# Patient Record
Sex: Male | Born: 1960 | Race: Black or African American | Hispanic: No | Marital: Single | Smoking: Never smoker
Health system: Southern US, Community
[De-identification: ages and names within clinical notes are randomized; demographics above are authoritative.]

## PROBLEM LIST (undated history)

## (undated) DIAGNOSIS — I10 Essential (primary) hypertension: Secondary | ICD-10-CM

## (undated) DIAGNOSIS — E119 Type 2 diabetes mellitus without complications: Secondary | ICD-10-CM

## (undated) HISTORY — PX: LITHOTRIPSY: SUR834

## (undated) HISTORY — PX: KIDNEY STONE SURGERY: SHX686

---

## 2000-01-14 ENCOUNTER — Ambulatory Visit (HOSPITAL_COMMUNITY): Admission: RE | Admit: 2000-01-14 | Discharge: 2000-01-14 | Payer: Self-pay | Admitting: Nephrology

## 2000-01-14 ENCOUNTER — Encounter: Payer: Self-pay | Admitting: Nephrology

## 2004-08-22 ENCOUNTER — Emergency Department: Payer: Self-pay | Admitting: Unknown Physician Specialty

## 2004-11-04 ENCOUNTER — Ambulatory Visit (HOSPITAL_COMMUNITY): Admission: RE | Admit: 2004-11-04 | Discharge: 2004-11-04 | Payer: Self-pay | Admitting: Nephrology

## 2004-11-06 ENCOUNTER — Ambulatory Visit (HOSPITAL_COMMUNITY): Admission: RE | Admit: 2004-11-06 | Discharge: 2004-11-06 | Payer: Self-pay | Admitting: Nephrology

## 2005-01-15 ENCOUNTER — Emergency Department: Payer: Self-pay | Admitting: Emergency Medicine

## 2005-01-21 ENCOUNTER — Ambulatory Visit: Payer: Self-pay | Admitting: Specialist

## 2005-02-04 ENCOUNTER — Ambulatory Visit: Payer: Self-pay | Admitting: Specialist

## 2005-02-10 ENCOUNTER — Emergency Department: Payer: Self-pay | Admitting: Unknown Physician Specialty

## 2005-02-24 ENCOUNTER — Emergency Department: Payer: Self-pay | Admitting: Emergency Medicine

## 2006-12-21 ENCOUNTER — Emergency Department: Payer: Self-pay | Admitting: Emergency Medicine

## 2007-12-04 ENCOUNTER — Emergency Department: Payer: Self-pay | Admitting: Unknown Physician Specialty

## 2007-12-05 ENCOUNTER — Emergency Department: Payer: Self-pay | Admitting: Emergency Medicine

## 2010-07-01 ENCOUNTER — Ambulatory Visit (HOSPITAL_COMMUNITY): Admission: RE | Admit: 2010-07-01 | Discharge: 2010-07-01 | Payer: Self-pay | Admitting: Neurosurgery

## 2010-11-03 LAB — GLUCOSE, CAPILLARY: Glucose-Capillary: 99 mg/dL (ref 70–99)

## 2012-08-06 ENCOUNTER — Emergency Department: Payer: Self-pay | Admitting: Internal Medicine

## 2012-08-07 ENCOUNTER — Emergency Department: Payer: Self-pay | Admitting: Emergency Medicine

## 2013-12-31 ENCOUNTER — Ambulatory Visit: Payer: Self-pay | Admitting: Gastroenterology

## 2014-01-08 ENCOUNTER — Ambulatory Visit: Payer: Self-pay | Admitting: Family Medicine

## 2014-07-19 ENCOUNTER — Emergency Department: Payer: Self-pay | Admitting: Student

## 2014-10-31 ENCOUNTER — Emergency Department: Payer: Self-pay | Admitting: Emergency Medicine

## 2014-11-05 ENCOUNTER — Ambulatory Visit: Payer: Self-pay

## 2014-11-07 ENCOUNTER — Ambulatory Visit: Payer: Self-pay | Admitting: Urology

## 2014-11-21 ENCOUNTER — Ambulatory Visit
Admit: 2014-11-21 | Disposition: A | Discharge status: Home / Self Care | Payer: Self-pay | Admitting: Obstetrics and Gynecology

## 2014-12-02 ENCOUNTER — Ambulatory Visit
Admit: 2014-12-02 | Disposition: A | Discharge status: Home / Self Care | Payer: Self-pay | Attending: Urology | Admitting: Urology

## 2014-12-02 LAB — BASIC METABOLIC PANEL
Anion Gap: 6 — ABNORMAL LOW (ref 7–16)
BUN: 15 mg/dL
CO2: 27 mmol/L
CREATININE: 1.06 mg/dL
Calcium, Total: 9 mg/dL
Chloride: 109 mmol/L
EGFR (Non-African Amer.): >60
GLUCOSE: 117 mg/dL — AB
Potassium: 4.6 mmol/L
Sodium: 142 mmol/L

## 2014-12-12 ENCOUNTER — Ambulatory Visit
Admit: 2014-12-12 | Disposition: A | Discharge status: Home / Self Care | Payer: Self-pay | Attending: Urology | Admitting: Urology

## 2014-12-22 NOTE — Op Note (Signed)
PATIENT NAME:  Jonathon Morris, Jonathon Morris MR#:  [id number] DATE OF BIRTH:  05/12/1961  DATE OF PROCEDURE:  12/12/2014  PREOPERATIVE DIAGNOSES:  Left ureteral stone, left kidney stone.    POSTOPERATIVE DIAGNOSES:  Left ureteral stone, left kidney stone.    PROCEDURE PERFORMED:  Left ureteroscopy, laser lithotripsy, left ureteral stent placement, retrograde pyelogram.   ANESTHESIA:  General anesthesia.  ATTENDING SURGEON:  Sherlynn Stalls, MD.   ESTIMATED BLOOD LOSS:  Minimal.   DRAINS:  A 6 x 26-French double-J ureteral stent on the left.    SPECIMEN:  Stone fragment.   COMPLICATIONS:  None.   INDICATION:  This is a 54 year old male who presented with acute-onset left flank pain and found to have two large stones; one approximately 13 mm in the left proximal ureter as well as a 1 mm nonobstructing stone in his left mid pole.  He was counseled on the various treatment options, and he elected to undergo left ureteroscopy for treatment of his stones.  Risks and benefits of the procedure were explained in detail.  The patient agreed to proceed as planned.   PROCEDURE IN DETAIL:  The patient was correctly identified in the preoperative holding area, and informed consent was confirmed.  He was brought to the operating suite and placed on the table in the supine position.  At this time, universal timeout protocol was performed and team members were identified.  Venodyne boots were placed and we administered IV Levaquin 500 mg in the perioperative period.  He was then placed under general anesthesia and prepped and draped in a standard surgical fashion after being repositioned in the dorsolithotomy position.  At this point in time, a rigid cystoscope using a 21-French access sheath was advanced per urethra into the bladder.  Of note, the prostate was fairly large and obstructing in appearance with a slightly elevated bladder neck.  Attention was then turned to the left ureteral orifice which was cannulated using  a 5-French open-ended ureteral catheter.  A Sensor wire was attempted to be advanced within the distal ureter but met resistance within the distal ureter which appeared to be secondary to J hooking from an enlarged prostate.  I was able to ultimately advance an angled Glidewire up to the level of the kidney without difficulty presumably past the ureteral stone, although this was not easily evident on fluoroscopy.  The 5-French open-ended ureteral catheter was then advanced up to the level of the proximal ureter and the Glidewire was exchanged for a Sensor wire.  The scope was then removed and a dual-lumen access sheath was advanced just within the distal ureter, and a second wire was introduced all the way up to the level of renal pelvis, confirmed under fluoroscopic guidance.  At this point in time, 1 wire was used as a safety wire and the second as a working wire.  An 8-French flexible Wolf ureteroscope was then advanced over the working wire all the way up to the level of the kidney without difficulty.  All of the calyces were directly visualized, and the stone in the mid pole calyx was identified.  Of note, it was fairly large in size and there was a bridge of mucosa overlying the middle of the stone such that the superior and inferior aspects of the stone were visualized.  At this point in time, a 200-micron laser fiber was brought in.  Using settings of 0.8 joules in 12 bursts, the laser was used to ablate the tissue bridge so the  full stone could be identified. The stone was then fragmented into very tiny fragments just slightly larger than the tip of the 200-micron laser fiber.  This was somewhat time-consuming as the stone appeared to be very layered and hard.  Once the stone was satisfactorily fragmented, the scope was backed to the level of the UPJ, and a retrograde pyelogram was performed to create a road map of the kidney.  At this point in time using the road map, each and every calyx was directly  visualized.  The scope was then slowly backed to the level of the ureter.  The proximal ureter was carefully inspected as this is where the large stone had been identified on a previous CT scan.  Upon removing the scope, I did encounter the stone within the distal ureter below the iliac crest near the level of the iliac vessels.  The stone was also fairly large in size and hard appearing.  The laser was then used to fragment the stone into very, very small pieces with care taken to avoid any injury to the ureter.  Once this was achieved, the scope and laser fiber were removed.  At this point in time, a semi-rigid 5-French long ureteroscope was then readvanced to the level where the stone was and individual larger pieces were basketed out using a 1.9-French nitinol basket.  At this point in time, the ureter was deemed clear of stone fragment.  Scope was able to be passed back up to the proximal ureter and there was no remaining stone fragments identified.  The scope was then removed.  The safety wire was then back-loaded over the rigid cystoscope, and a 6 x 26-French double-J ureteral stent was advanced to the level of the kidney.  The wire was partially withdrawn and a coil was noted within the renal pelvis.  The wire was then fully withdrawn and the coil was noted within the bladder.  The bladder was then drained.  Several small stone fragments were captured to be sent for stone analysis.  The patient was then repositioned in the supine position.  After being cleaned and dried and reversed from anesthesia, he was taken to the PACU in stable condition.  There were no complications.      ____________________________ Sherlynn Stalls, MD ajb:kc D: 12/12/2014 15:36:38 ET T: 12/12/2014 16:47:17 ET JOB#: [id number]  cc: Sherlynn Stalls, MD, <Dictator> Sherlynn Stalls MD ELECTRONICALLY SIGNED 12/19/2014 16:24

## 2015-02-10 ENCOUNTER — Other Ambulatory Visit: Payer: Self-pay | Admitting: Family Medicine

## 2015-02-10 DIAGNOSIS — N200 Calculus of kidney: Secondary | ICD-10-CM

## 2015-03-28 ENCOUNTER — Encounter: Payer: Self-pay | Admitting: Emergency Medicine

## 2015-03-28 ENCOUNTER — Emergency Department
Admission: EM | Admit: 2015-03-28 | Discharge: 2015-03-28 | Disposition: A | Discharge status: Home / Self Care | Payer: Worker's Compensation | Attending: Emergency Medicine | Admitting: Emergency Medicine

## 2015-03-28 DIAGNOSIS — X12XXXA Contact with other hot fluids, initial encounter: Secondary | ICD-10-CM | POA: Diagnosis not present

## 2015-03-28 DIAGNOSIS — T25029A Burn of unspecified degree of unspecified foot, initial encounter: Secondary | ICD-10-CM | POA: Diagnosis present

## 2015-03-28 DIAGNOSIS — Y9389 Activity, other specified: Secondary | ICD-10-CM | POA: Diagnosis not present

## 2015-03-28 DIAGNOSIS — T25121A Burn of first degree of right foot, initial encounter: Secondary | ICD-10-CM | POA: Diagnosis not present

## 2015-03-28 DIAGNOSIS — Y9289 Other specified places as the place of occurrence of the external cause: Secondary | ICD-10-CM | POA: Diagnosis not present

## 2015-03-28 DIAGNOSIS — T300 Burn of unspecified body region, unspecified degree: Secondary | ICD-10-CM

## 2015-03-28 DIAGNOSIS — Y990 Civilian activity done for income or pay: Secondary | ICD-10-CM | POA: Diagnosis not present

## 2015-03-28 NOTE — Discharge Instructions (Signed)
Burn Care Your skin is a natural barrier to infection. It is the largest organ of your body. Burns damage this natural protection. To help prevent infection, it is very important to follow your caregiver's instructions in the care of your burn. Burns are classified as:  First degree. There is only redness of the skin (erythema). No scarring is expected.  Second degree. There is blistering of the skin. Scarring may occur with deeper burns.  Third degree. All layers of the skin are injured, and scarring is expected. HOME CARE INSTRUCTIONS   Wash your hands well before changing your bandage.  Change your bandage as often as directed by your caregiver.  Remove the old bandage. If the bandage sticks, you may soak it off with cool, clean water.  Cleanse the burn thoroughly but gently with mild soap and water.  Pat the area dry with a clean, dry cloth.  Apply a thin layer of antibacterial cream to the burn.  Apply a clean bandage as instructed by your caregiver.  Keep the bandage as clean and dry as possible.  Elevate the affected area for the first 24 hours, then as instructed by your caregiver.  Only take over-the-counter or prescription medicines for pain, discomfort, or fever as directed by your caregiver. SEEK IMMEDIATE MEDICAL CARE IF:   You develop excessive pain.  You develop redness, tenderness, swelling, or red streaks near the burn.  The burned area develops yellowish-white fluid (pus) or a bad smell.  You have a fever. MAKE SURE YOU:   Understand these instructions.  Will watch your condition.  Will get help right away if you are not doing well or get worse. Document Released: 08/09/2005 Document Revised: 11/01/2011 Document Reviewed: 12/30/2010 [facility] Patient Information 2015 [place], [place]. This information is not intended to replace advice given to you by your health care provider. Make sure you discuss any questions you have with your health care  provider.   Apply moisturizing cream to keep the skin supple.
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
## 2015-09-02 ENCOUNTER — Other Ambulatory Visit: Payer: Self-pay

## 2015-09-02 ENCOUNTER — Ambulatory Visit
Admission: RE | Admit: 2015-09-02 | Discharge: 2015-09-02 | Disposition: A | Discharge status: Home / Self Care | Payer: Managed Care, Other (non HMO) | Source: Ambulatory Visit | Attending: Urology | Admitting: Urology

## 2015-09-02 ENCOUNTER — Telehealth: Payer: Self-pay

## 2015-09-02 DIAGNOSIS — N200 Calculus of kidney: Secondary | ICD-10-CM | POA: Diagnosis present

## 2015-09-02 DIAGNOSIS — Z87442 Personal history of urinary calculi: Secondary | ICD-10-CM | POA: Diagnosis not present

## 2015-09-02 NOTE — Telephone Encounter (Signed)
-----   Message from Harle BattiestShannon A McGowan, PA-C sent at 09/02/2015 12:04 PM EST ----- Patient has not made an appointment to discuss his pain and KUB results.

## 2015-09-02 NOTE — Progress Notes (Signed)
Pt called stating he had ESWL last April by Dr. Apolinar JunesBrandon and is starting to have left side flank pain again. Pt requested a KUB. Per Carollee HerterShannon orders were placed.

## 2015-09-02 NOTE — Telephone Encounter (Signed)
Spoke with pt and made aware for the need of an office visit. Pt voiced understanding and was transferred to the front to make a f/u appt.

## 2015-09-05 ENCOUNTER — Ambulatory Visit: Payer: Self-pay | Admitting: Urology

## 2015-09-16 ENCOUNTER — Encounter: Payer: Managed Care, Other (non HMO) | Admitting: Urology

## 2015-10-01 NOTE — Progress Notes (Signed)
This encounter was created in error - please disregard.

## 2017-04-10 ENCOUNTER — Ambulatory Visit
Admission: EM | Admit: 2017-04-10 | Discharge: 2017-04-10 | Disposition: A | Discharge status: Home / Self Care | Payer: 59 | Attending: Family Medicine | Admitting: Family Medicine

## 2017-04-10 DIAGNOSIS — M26629 Arthralgia of temporomandibular joint, unspecified side: Secondary | ICD-10-CM

## 2017-04-10 DIAGNOSIS — S0340XA Sprain of jaw, unspecified side, initial encounter: Secondary | ICD-10-CM

## 2017-04-10 DIAGNOSIS — R202 Paresthesia of skin: Secondary | ICD-10-CM

## 2017-04-10 HISTORY — DX: Essential (primary) hypertension: I10

## 2017-04-10 HISTORY — DX: Type 2 diabetes mellitus without complications: E11.9

## 2017-04-10 MED ORDER — MELOXICAM 15 MG PO TABS: 15.0000 mg | ORAL_TABLET | Freq: Every day | ORAL | 0 refills | Status: DC

## 2017-04-10 NOTE — ED Provider Notes (Signed)
[facility]    CSN: [id number] Arrival date & time: 04/10/17  1039     History   Chief Complaint Chief Complaint  Patient presents with  . Jaw Pain    HPI Jonathon Morris is a 56 y.o. male.   56 year old obese black male comes to the urgent care because he states she's had a stroke. He reports he was chewing some jellybeans when suddenly felt sharp pain in his right side of his jaw and numbness. He came in directly be seen and evaluated. Since she's been here his notes improvement of the jaw the numbness is gone away the pain in the jaw is improved as well. He has a history of grinding his teeth at night. Past smoker problems hypertension and type 2 diabetes surgeries a kidney stone surgery and lithotripsy. No known drug allergies. He does not smoke. No pertinent family medical history relevant to today's visit.   The history is provided by the patient. No language interpreter was used.  Facial Injury  Mechanism of injury:  Unable to specify Location:  R cheek Time since incident:  2 hours Pain details:    Quality:  Aching and shooting   Severity:  Moderate   Timing:  Intermittent   Progression:  Waxing and waning Foreign body present:  No foreign bodies Relieved by:  Nothing Worsened by:  Nothing Associated symptoms: no altered mental status, no epistaxis, no neck pain and no rhinorrhea     Past Medical History:  Diagnosis Date  . Hypertension   . Type 2 diabetes mellitus (HCC)     There are no active problems to display for this patient.   Past Surgical History:  Procedure Laterality Date  . KIDNEY STONE SURGERY    . LITHOTRIPSY         Home Medications    Prior to Admission medications   Medication Sig Start Date End Date Taking? Authorizing Provider  finasteride (PROSCAR) 5 MG tablet Take 5 mg by mouth daily.   Yes [provider]  losartan (COZAAR) 100 MG tablet Take 100 mg by mouth daily.   Yes [provider]  metformin  (FORTAMET) 500 MG (OSM) 24 hr tablet Take 500 mg by mouth daily with breakfast.   Yes [provider]  sildenafil (VIAGRA) 100 MG tablet Take 100 mg by mouth daily as needed for erectile dysfunction.   Yes [provider]  timolol (TIMOPTIC) 0.5 % ophthalmic solution 1 drop 2 (two) times daily.   Yes [provider]  meloxicam (MOBIC) 15 MG tablet Take 1 tablet (15 mg total) by mouth daily. 04/10/17   Hassan Rowan, MD    Family History History reviewed. No pertinent family history.  Social History Social History  Substance Use Topics  . Smoking status: Never Smoker  . Smokeless tobacco: Never Used  . Alcohol use No     Allergies   Patient has no known allergies.   Review of Systems Review of Systems  HENT: Positive for dental problem and facial swelling. Negative for nosebleeds and rhinorrhea.   Musculoskeletal: Negative for neck pain.  All other systems reviewed and are negative.    Physical Exam Triage Vital Signs ED Triage Vitals  Enc Vitals Group     BP 04/10/17 1204 140/82     Pulse Rate 04/10/17 1204 71     Resp 04/10/17 1204 16     Temp 04/10/17 1204 98.2 F (36.8 C)     Temp Source 04/10/17 1204  Oral     SpO2 04/10/17 1204 100 %     Weight 04/10/17 1200 237 lb (107.5 kg)     Height 04/10/17 1200 5\' 6"  (1.676 m)     Head Circumference --      Peak Flow --      Pain Score 04/10/17 1200 0     Pain Loc --      Pain Edu? --      Excl. in GC? --    No data found.   Updated Vital Signs BP 140/82 (BP Location: Left Arm)   Pulse 71   Temp 98.2 F (36.8 C) (Oral)   Resp 16   Ht 5\' 6"  (1.676 m)   Wt 237 lb (107.5 kg)   SpO2 100%   BMI 38.25 kg/m   Visual Acuity Right Eye Distance:   Left Eye Distance:   Bilateral Distance:    Right Eye Near:   Left Eye Near:    Bilateral Near:     Physical Exam  Constitutional: He appears well-developed and well-nourished.  HENT:  Head: Normocephalic and atraumatic.    Right Ear:  External ear normal.  Left Ear: External ear normal.  Nose: Nose normal.  Mouth/Throat: Uvula is midline, oropharynx is clear and moist and mucous membranes are normal.  Neurologically no signs of significant difference in the 2 sides of face no signs of cranial nerve VII injury or disruption. Patient does have marked tenderness in the over the right TMJ joint with palpation able to reproduce the discomfort he had while driving while he was chewing the jellybeans  Eyes: Pupils are equal, round, and reactive to light.  Neck: Normal range of motion. Neck supple.  Pulmonary/Chest: Effort normal.  Musculoskeletal: Normal range of motion.  Neurological: He is alert.  Skin: Skin is warm. He is not diaphoretic.  Psychiatric: He has a normal mood and affect.  Vitals reviewed.    UC Treatments / Results  Labs (all labs ordered are listed, but only abnormal results are displayed) Labs Reviewed - No data to display  EKG  EKG Interpretation None       Radiology No results found.  Procedures Procedures (including critical care time)  Medications Ordered in UC Medications - No data to display   Initial Impression / Assessment and Plan / UC Course  I have reviewed the triage vital signs and the nursing notes.  Pertinent labs & imaging results that were available during my care of the patient were reviewed by me and considered in my medical decision making (see chart for details).     The patient had a TMJ inflammation and irritation. Turns out he does grind his teeth at night Jonathon Morris has warned him he should be wearing a mouthguard amount of grinding of his teeth that he does recommend Mobic 15 mg 1 tablet a day follow-up of his tenderness is needed he still has some teeth they may need to be extracted. He declined a work note  Final Clinical Impressions(s) / UC Diagnoses   Final diagnoses:  TMJ (sprain of temporomandibular joint), initial encounter  TMJ syndrome    New  Prescriptions Current Discharge Medication List    START taking these medications   Details  meloxicam (MOBIC) 15 MG tablet Take 1 tablet (15 mg total) by mouth daily. Qty: 30 tablet, Refills: 0       Note: This dictation was prepared with Dragon dictation along with smaller phrase technology. Any transcriptional errors that result from this  process are unintentional. Controlled Substance Prescriptions [place] Controlled Substance Registry consulted? Not Applicable   Hassan Rowan, MD 04/10/17 1258

## 2017-04-10 NOTE — ED Triage Notes (Signed)
Patient complains of jaw pain that occurred after eating jelly beans this morning. Patient states that he started having numbness in his temple area. Patient states that now he feels a lump in his cheek area.

## 2017-10-02 ENCOUNTER — Ambulatory Visit
Admission: EM | Admit: 2017-10-02 | Discharge: 2017-10-02 | Disposition: A | Discharge status: Home / Self Care | Payer: 59 | Attending: Family Medicine | Admitting: Family Medicine

## 2017-10-02 ENCOUNTER — Other Ambulatory Visit: Payer: Self-pay

## 2017-10-02 DIAGNOSIS — R109 Unspecified abdominal pain: Secondary | ICD-10-CM

## 2017-10-02 LAB — URINALYSIS, COMPLETE (UACMP) WITH MICROSCOPIC
BILIRUBIN URINE: NEGATIVE
Bacteria, UA: NONE SEEN
GLUCOSE, UA: NEGATIVE mg/dL
KETONES UR: NEGATIVE mg/dL
LEUKOCYTES UA: NEGATIVE
NITRITE: NEGATIVE
PH: 5 (ref 5.0–8.0)
Protein, ur: NEGATIVE mg/dL
SPECIFIC GRAVITY, URINE: 1.01 (ref 1.005–1.030)

## 2017-10-02 MED ORDER — HYDROCODONE-ACETAMINOPHEN 5-325 MG PO TABS: 1.0000 | ORAL_TABLET | Freq: Four times a day (QID) | ORAL | 0 refills | Status: DC | PRN

## 2017-10-02 MED ORDER — TAMSULOSIN HCL 0.4 MG PO CAPS: 0.4000 mg | ORAL_CAPSULE | Freq: Every day | ORAL | 0 refills | Status: DC

## 2017-10-02 NOTE — ED Triage Notes (Addendum)
Pt in left low back/flank x past 3 weeks. Pain 3/10 currently. Pt has urinary sx that he's been taking medication for so unsure if he Has had new sx. Denies recent injury. Has had previous kidney stones

## 2017-10-02 NOTE — ED Provider Notes (Signed)
[facility]    CSN: [id number] Arrival date & time: 10/02/17  7829     History   Chief Complaint Chief Complaint  Patient presents with  . Flank Pain    HPI Jonathon Morris is a 57 y.o. male.   57 yo male with a c/o left sided flank pain for the past 3 weeks worsening over the last couple of days and associated with mild discomfort when urinating. Denies any injuries, blood in the urine, fevers, chills, vomiting. States has had kidney stones in the past requiring lithotripsy.    The history is provided by the patient.  Flank Pain     Past Medical History:  Diagnosis Date  . Hypertension   . Type 2 diabetes mellitus (HCC)     There are no active problems to display for this patient.   Past Surgical History:  Procedure Laterality Date  . KIDNEY STONE SURGERY    . LITHOTRIPSY         Home Medications    Prior to Admission medications   Medication Sig Start Date End Date Taking? Authorizing Provider  finasteride (PROSCAR) 5 MG tablet Take 5 mg by mouth daily.    [provider]  HYDROcodone-acetaminophen (NORCO/VICODIN) 5-325 MG tablet Take 1-2 tablets by mouth every 6 (six) hours as needed. 10/02/17   [name], MD  losartan (COZAAR) 100 MG tablet Take 100 mg by mouth daily.    [provider]  meloxicam (MOBIC) 15 MG tablet Take 1 tablet (15 mg total) by mouth daily. 04/10/17   Hassan Rowan, MD  metformin (FORTAMET) 500 MG (OSM) 24 hr tablet Take 500 mg by mouth daily with breakfast.    [provider]  sildenafil (VIAGRA) 100 MG tablet Take 100 mg by mouth daily as needed for erectile dysfunction.    [provider]  tamsulosin (FLOMAX) 0.4 MG CAPS capsule Take 1 capsule (0.4 mg total) by mouth daily. 10/02/17   [name], MD  timolol (TIMOPTIC) 0.5 % ophthalmic solution 1 drop 2 (two) times daily.    [provider]    Family History Family History  Problem Relation Age of Onset  . ALS Mother    . Hypertension Father   . Diabetes Father     Social History Social History   Tobacco Use  . Smoking status: Never Smoker  . Smokeless tobacco: Never Used  Substance Use Topics  . Alcohol use: No  . Drug use: No     Allergies   Patient has no known allergies.   Review of Systems Review of Systems  Genitourinary: Positive for flank pain.     Physical Exam Triage Vital Signs ED Triage Vitals  Enc Vitals Group     BP 10/02/17 0904 (!) 145/72     Pulse Rate 10/02/17 0904 67     Resp 10/02/17 0904 20     Temp 10/02/17 0904 98.2 F (36.8 C)     Temp Source 10/02/17 0904 Oral     SpO2 10/02/17 0904 98 %     Weight 10/02/17 0906 230 lb (104.3 kg)     Height 10/02/17 0906 5\' 6"  (1.676 m)     Head Circumference --      Peak Flow --      Pain Score 10/02/17 0906 3     Pain Loc --      Pain Edu? --      Excl. in GC? --    No data found.  Updated Vital Signs BP (!) 145/72 (BP Location: Left Arm)   Pulse 67   Temp 98.2 F (36.8 C) (Oral)   Resp 20   Ht 5\' 6"  (1.676 m)   Wt 230 lb (104.3 kg)   SpO2 98%   BMI 37.12 kg/m   Visual Acuity Right Eye Distance:   Left Eye Distance:   Bilateral Distance:    Right Eye Near:   Left Eye Near:    Bilateral Near:     Physical Exam  Constitutional: He appears well-developed and well-nourished. No distress.  Neck: Normal range of motion. Neck supple. No tracheal deviation present.  Pulmonary/Chest: Effort normal. No stridor. No respiratory distress.  Abdominal: Soft. Bowel sounds are normal. He exhibits no distension and no mass. There is tenderness (left flank). There is no rebound and no guarding. No hernia.  Musculoskeletal:       Lumbar back: Normal.  Neurological: He has normal reflexes.  Skin: No rash noted. He is not diaphoretic.  Nursing note and vitals reviewed.    UC Treatments / Results  Labs (all labs ordered are listed, but only abnormal results are displayed) Labs Reviewed  URINALYSIS, COMPLETE  (UACMP) WITH MICROSCOPIC - Abnormal; Notable for the following components:      Result Value   Color, Urine STRAW (*)    Hgb urine dipstick TRACE (*)    Squamous Epithelial / LPF 0-5 (*)    All other components within normal limits    EKG  EKG Interpretation None       Radiology No results found.  Procedures Procedures (including critical care time)  Medications Ordered in UC Medications - No data to display   Initial Impression / Assessment and Plan / UC Course  I have reviewed the triage vital signs and the nursing notes.  Pertinent labs & imaging results that were available during my care of the patient were reviewed by me and considered in my medical decision making (see chart for details).       Final Clinical Impressions(s) / UC Diagnoses   Final diagnoses:  Left flank pain    ED Discharge Orders        Ordered    tamsulosin (FLOMAX) 0.4 MG CAPS capsule  Daily     10/02/17 1008    HYDROcodone-acetaminophen (NORCO/VICODIN) 5-325 MG tablet  Every 6 hours PRN     10/02/17 1009     1. Lab results and diagnosis reviewed with patient 2. rx as per orders above; reviewed possible side effects, interactions, risks and benefits  3. Recommend supportive treatment with increase fluids 4. Follow-up prn if symptoms worsen or don't improve  Controlled Substance Prescriptions [place] Controlled Substance Registry consulted? Not Applicable   Payton Mccallumonty, Clevester Helzer, MD 10/02/17 1046

## 2017-10-04 ENCOUNTER — Telehealth: Payer: Self-pay

## 2017-10-04 NOTE — Telephone Encounter (Signed)
Pt called stating [facility] was [facility] over the weekend and was dx with a kidney stone. Pt stated that no imaging was performed but flomax and vicodin was given. Pt requested orders to be placed for an xray. Made pt aware he has not been seen since 2017 therefore he would need an OV prior to orders being placed. Pt stated that he cant wait that long, therefore he would go to the ER for imaging.

## 2017-10-05 ENCOUNTER — Emergency Department
Admission: EM | Admit: 2017-10-05 | Discharge: 2017-10-05 | Disposition: A | Discharge status: Home / Self Care | Payer: 59 | Attending: Emergency Medicine | Admitting: Emergency Medicine

## 2017-10-05 ENCOUNTER — Encounter: Payer: Self-pay | Admitting: Emergency Medicine

## 2017-10-05 ENCOUNTER — Emergency Department: Payer: 59

## 2017-10-05 ENCOUNTER — Other Ambulatory Visit: Payer: Self-pay

## 2017-10-05 DIAGNOSIS — N23 Unspecified renal colic: Secondary | ICD-10-CM | POA: Diagnosis not present

## 2017-10-05 DIAGNOSIS — E119 Type 2 diabetes mellitus without complications: Secondary | ICD-10-CM | POA: Insufficient documentation

## 2017-10-05 DIAGNOSIS — Z7901 Long term (current) use of anticoagulants: Secondary | ICD-10-CM | POA: Diagnosis not present

## 2017-10-05 DIAGNOSIS — N200 Calculus of kidney: Secondary | ICD-10-CM | POA: Diagnosis not present

## 2017-10-05 DIAGNOSIS — Z79899 Other long term (current) drug therapy: Secondary | ICD-10-CM | POA: Insufficient documentation

## 2017-10-05 DIAGNOSIS — R1012 Left upper quadrant pain: Secondary | ICD-10-CM | POA: Diagnosis present

## 2017-10-05 DIAGNOSIS — I10 Essential (primary) hypertension: Secondary | ICD-10-CM | POA: Insufficient documentation

## 2017-10-05 LAB — BASIC METABOLIC PANEL
Anion gap: 9 (ref 5–15)
BUN: 24 mg/dL — AB (ref 6–20)
CHLORIDE: 110 mmol/L (ref 101–111)
CO2: 22 mmol/L (ref 22–32)
Calcium: 9 mg/dL (ref 8.9–10.3)
Creatinine, Ser: 1.2 mg/dL (ref 0.61–1.24)
GFR calc Af Amer: >60 mL/min (ref >60)
GFR calc non Af Amer: >60 mL/min (ref >60)
Glucose, Bld: 108 mg/dL — ABNORMAL HIGH (ref 65–99)
POTASSIUM: 4 mmol/L (ref 3.5–5.1)
SODIUM: 141 mmol/L (ref 135–145)

## 2017-10-05 LAB — CBC
HEMATOCRIT: 40.9 % (ref 40.0–52.0)
HEMOGLOBIN: 13.7 g/dL (ref 13.0–18.0)
MCH: 28.7 pg (ref 26.0–34.0)
MCHC: 33.4 g/dL (ref 32.0–36.0)
MCV: 85.7 fL (ref 80.0–100.0)
Platelets: 281 10*3/uL (ref 150–440)
RBC: 4.76 MIL/uL (ref 4.40–5.90)
RDW: 14.1 % (ref 11.5–14.5)
WBC: 6.5 10*3/uL (ref 3.8–10.6)

## 2017-10-05 NOTE — ED Provider Notes (Signed)
[facility] EMERGENCY DEPARTMENT Provider Note   CSN: [id number] Arrival date & time: 10/05/17  1901     History   Chief Complaint Chief Complaint  Patient presents with  . Back Pain    HPI Jonathon Morris is a 57 y.o. male presents to the emergency department for evaluation of left-sided flank pain.  Symptoms been present for the last few weeks.  He was seen at urgent care facility a few days ago and had been diagnosed with hematuria.  He has a history of kidney stones  Requiring lithotripsy in the past.  He has left flank pain rating to his left groin for the last 24 hours.  Patient states his pain has been improving.  He has been urinating well with no gross blood or decrease in urinary frequency.  He denies any fevers nausea or vomiting.  He had been taking Norco earlier today but states the pain is mild.  He would like to know if he is having a kidney stone. HPI  Past Medical History:  Diagnosis Date  . Hypertension   . Type 2 diabetes mellitus (HCC)     There are no active problems to display for this patient.   Past Surgical History:  Procedure Laterality Date  . KIDNEY STONE SURGERY    . LITHOTRIPSY         Home Medications    Prior to Admission medications   Medication Sig Start Date End Date Taking? Authorizing Provider  finasteride (PROSCAR) 5 MG tablet Take 5 mg by mouth daily.    [provider]  HYDROcodone-acetaminophen (NORCO/VICODIN) 5-325 MG tablet Take 1-2 tablets by mouth every 6 (six) hours as needed. 10/02/17   [name], MD  losartan (COZAAR) 100 MG tablet Take 100 mg by mouth daily.    [provider]  meloxicam (MOBIC) 15 MG tablet Take 1 tablet (15 mg total) by mouth daily. 04/10/17   Hassan Rowan, MD  metformin (FORTAMET) 500 MG (OSM) 24 hr tablet Take 500 mg by mouth daily with breakfast.    [provider]  sildenafil (VIAGRA) 100 MG tablet Take 100 mg by mouth daily as needed for erectile  dysfunction.    [provider]  tamsulosin (FLOMAX) 0.4 MG CAPS capsule Take 1 capsule (0.4 mg total) by mouth daily. 10/02/17   [name], MD  timolol (TIMOPTIC) 0.5 % ophthalmic solution 1 drop 2 (two) times daily.    [provider]    Family History Family History  Problem Relation Age of Onset  . ALS Mother   . Hypertension Father   . Diabetes Father     Social History Social History   Tobacco Use  . Smoking status: Never Smoker  . Smokeless tobacco: Never Used  Substance Use Topics  . Alcohol use: No  . Drug use: No     Allergies   Patient has no known allergies.   Review of Systems Review of Systems  Constitutional: Negative for fever.  Gastrointestinal: Negative for abdominal pain, diarrhea, nausea and vomiting.  Genitourinary: Positive for flank pain. Negative for difficulty urinating, dysuria and frequency.  Musculoskeletal: Negative for arthralgias, back pain, gait problem and joint swelling.     Physical Exam Updated Vital Signs BP (!) 166/76 (BP Location: Left Arm)   Pulse 74   Temp 98.8 F (37.1 C) (Oral)   Resp 18   Ht 5\' 6"  (1.676 m)   Wt 104.3 kg (230 lb)   SpO2 98%  BMI 37.12 kg/m   Physical Exam  Constitutional: He is oriented to person, place, and time. He appears well-developed and well-nourished.  HENT:  Head: Normocephalic and atraumatic.  Eyes: Conjunctivae are normal.  Neck: Normal range of motion.  Cardiovascular: Normal rate.  Pulmonary/Chest: Effort normal. No respiratory distress.  Abdominal: Soft. He exhibits no distension and no mass. There is no tenderness. There is no rebound and no guarding.  Very mild CVA tenderness to percussion on the left side.  No abdominal tenderness to palpation.  Musculoskeletal: Normal range of motion.  Neurological: He is alert and oriented to person, place, and time.  Skin: Skin is warm. No rash noted.  Psychiatric: He has a normal mood and affect. His behavior is  normal. Thought content normal.     ED Treatments / Results  Labs (all labs ordered are listed, but only abnormal results are displayed) Labs Reviewed  BASIC METABOLIC PANEL - Abnormal; Notable for the following components:      Result Value   Glucose, Bld 108 (*)    BUN 24 (*)    All other components within normal limits  CBC    EKG  EKG Interpretation None       Radiology Ct Renal Stone Study  Result Date: 10/05/2017 CLINICAL DATA:  Low back pain for 1 month.  Kidney stone. EXAM: CT ABDOMEN AND PELVIS WITHOUT CONTRAST TECHNIQUE: Multidetector CT imaging of the abdomen and pelvis was performed following the standard protocol without IV contrast. COMPARISON:  CT 3 10 16  FINDINGS: Lower chest: Lung bases are clear. Hepatobiliary: No focal hepatic lesion. No biliary duct dilatation. Gallbladder is normal. Common bile duct is normal. Pancreas: Pancreas is normal. No ductal dilatation. No pancreatic inflammation. Spleen: Normal spleen Adrenals/urinary tract: Adrenal glands are normal. Nonobstructing calculus in the mid LEFT kidney measures 4 mm. No ureterolithiasis or obstructive uropathy. No bladder calculi. Stomach/Bowel: Stomach, small bowel, appendix, and cecum are normal. The colon and rectosigmoid colon are normal. Vascular/Lymphatic: Abdominal aorta is normal caliber. No periportal or retroperitoneal adenopathy. No pelvic adenopathy. Reproductive: Prostate gland mildly enlarged. Other: No free fluid. Musculoskeletal: No aggressive osseous lesion. IMPRESSION: 1. Nonobstructing LEFT renal calculus. 2. No ureterolithiasis or obstructive uropathy. 3. No explanation for back pain. Electronically Signed   By: Genevive BiStewart  Edmunds M.D.   On: 10/05/2017 21:14    Procedures Procedures (including critical care time)  Medications Ordered in ED Medications - No data to display   Initial Impression / Assessment and Plan / ED Course  I have reviewed the triage vital signs and the nursing  notes.  Pertinent labs & imaging results that were available during my care of the patient were reviewed by me and considered in my medical decision making (see chart for details).     57 year old male with a history of kidney stones presents with left flank pain.  Few days ago had blood in his urine and was given Norco and Flomax.  Patient has been taking these medications today noticed slight increase in severe pain that has currently improved while he is in the ER and is mild.  Requesting imaging to evaluate for kidney stone.  CT scan showed nonobstructing left renal calculus with no obstructive uropathy.  Patient encouraged to continue increasing fluids, there is a possibility that he could have passed a recent stone due to the fact that his pain has significantly improved over the past few hours.  He is encouraged to follow-up with primary care provider or urologist in the  next few days.  He is educated on signs and symptoms return to the ED for.  Final Clinical Impressions(s) / ED Diagnoses   Final diagnoses:  Renal colic on left side    ED Discharge Orders    None       Ronnette Juniper 10/05/17 2232    Myrna Blazer, MD 10/06/17 [phone]

## 2017-10-05 NOTE — ED Triage Notes (Addendum)
Patient ambulatory to triage with steady gait, without difficulty or distress noted; pt reports lower back pain x month; denies any accomp symptoms, denies radiating pain; st pain only with movement; st hx kidney stone and pain does not feel like a stone; rx flomax and hydrocodone by Urgent Care for hematuria

## 2017-10-05 NOTE — ED Notes (Signed)

## 2017-10-05 NOTE — Discharge Instructions (Signed)
Please continue with Tylenol and Norco as needed for any pain.  Make sure drinking lots of fluids.  If any fevers, increasing pain return to the ER.

## 2017-10-17 ENCOUNTER — Ambulatory Visit
Admission: RE | Admit: 2017-10-17 | Discharge: 2017-10-17 | Disposition: A | Discharge status: Home / Self Care | Payer: 59 | Source: Ambulatory Visit | Attending: Urology | Admitting: Urology

## 2017-10-17 ENCOUNTER — Ambulatory Visit: Payer: 59 | Admitting: Urology

## 2017-10-17 ENCOUNTER — Encounter: Payer: Self-pay | Admitting: Urology

## 2017-10-17 VITALS — BP 147/85 | HR 83 | Ht 66.0 in | Wt 230.0 lb

## 2017-10-17 DIAGNOSIS — N200 Calculus of kidney: Secondary | ICD-10-CM | POA: Diagnosis not present

## 2017-10-17 DIAGNOSIS — R399 Unspecified symptoms and signs involving the genitourinary system: Secondary | ICD-10-CM | POA: Diagnosis not present

## 2017-10-17 LAB — MICROSCOPIC EXAMINATION
Bacteria, UA: NONE SEEN
EPITHELIAL CELLS (NON RENAL): NONE SEEN /HPF (ref 0–10)
WBC, UA: NONE SEEN /hpf (ref 0–?)

## 2017-10-17 LAB — URINALYSIS, COMPLETE
Bilirubin, UA: NEGATIVE
Glucose, UA: NEGATIVE
KETONES UA: NEGATIVE
Leukocytes, UA: NEGATIVE
NITRITE UA: NEGATIVE
Protein, UA: NEGATIVE
Specific Gravity, UA: 1.025 (ref 1.005–1.030)
Urobilinogen, Ur: 0.2 mg/dL (ref 0.2–1.0)
pH, UA: 5.5 (ref 5.0–7.5)

## 2017-10-17 MED ORDER — TAMSULOSIN HCL 0.4 MG PO CAPS: 0.4000 mg | ORAL_CAPSULE | Freq: Every day | ORAL | 6 refills | Status: DC

## 2017-10-17 NOTE — Progress Notes (Signed)
10/17/2017 8:58 AM   Jonathon Morris [birth year] [id number]  Referring provider: Dione Housekeeper, MD [address] [place], [place] [address]  Chief Complaint  Patient presents with  . Nephrolithiasis  . Follow-up    ER follow up    HPI: 57 year old male who presents for nephrolithiasis.  He was seen in the ED on 10/05/2017 complaining of left flank pain.  His pain was precipitated and exacerbated with movement and bending.  He has a prior history of stone disease and a stone protocol CT was performed which showed a nonobstructing 4 mm left renal calculus.  He states his discomfort has significantly improved.  He has noted urinary frequency, intermittency and straining to urinate.  He was started on tamsulosin in the ED and states this has improved his voiding symptoms.  He would like to continue this medication.     PMH: Past Medical History:  Diagnosis Date  . Hypertension   . Type 2 diabetes mellitus [facility])     Surgical History: Past Surgical History:  Procedure Laterality Date  . KIDNEY STONE SURGERY    . LITHOTRIPSY      Home Medications:  Allergies as of 10/17/2017   No Known Allergies     Medication List        Accurate as of 10/17/17  8:58 AM. Always use your most recent med list.          finasteride 5 MG tablet Commonly known as:  PROSCAR Take 5 mg by mouth daily.   HYDROcodone-acetaminophen 5-325 MG tablet Commonly known as:  NORCO/VICODIN Take 1-2 tablets by mouth every 6 (six) hours as needed.   losartan 100 MG tablet Commonly known as:  COZAAR Take 100 mg by mouth daily.   meloxicam 15 MG tablet Commonly known as:  MOBIC Take 1 tablet (15 mg total) by mouth daily.   metformin 500 MG (OSM) 24 hr tablet Commonly known as:  FORTAMET Take 500 mg by mouth daily with breakfast.   sildenafil 100 MG tablet Commonly known as:  VIAGRA Take 100 mg by mouth daily as needed for erectile dysfunction.   tamsulosin 0.4 MG Caps capsule Commonly  known as:  FLOMAX Take 1 capsule (0.4 mg total) by mouth daily.   timolol 0.5 % ophthalmic solution Commonly known as:  TIMOPTIC 1 drop 2 (two) times daily.       Allergies: No Known Allergies  Family History: Family History  Problem Relation Age of Onset  . ALS Mother   . Hypertension Father   . Diabetes Father     Social History:  reports that  has never smoked. he has never used smokeless tobacco. He reports that he does not drink alcohol or use drugs.  ROS: UROLOGY Frequent Urination?: Yes Hard to postpone urination?: Yes Burning/pain with urination?: Yes Get up at night to urinate?: Yes Leakage of urine?: Yes Urine stream starts and stops?: Yes Trouble starting stream?: Yes Do you have to strain to urinate?: Yes Blood in urine?: No Urinary tract infection?: No Sexually transmitted disease?: No Injury to kidneys or bladder?: No Painful intercourse?: No Weak stream?: Yes Erection problems?: Yes Penile pain?: No  Gastrointestinal Nausea?: Yes Vomiting?: No Indigestion/heartburn?: Yes Diarrhea?: No Constipation?: No  Constitutional Fever: No Night sweats?: No Weight loss?: No Fatigue?: No  Skin Skin rash/lesions?: No Itching?: Yes  Eyes Blurred vision?: No Double vision?: No  Ears/Nose/Throat Sore throat?: No Sinus problems?: Yes  Hematologic/Lymphatic Swollen glands?: No Easy bruising?: No  Cardiovascular  Leg swelling?: No Chest pain?: No  Respiratory Cough?: No Shortness of breath?: No  Endocrine Excessive thirst?: No  Musculoskeletal Back pain?: Yes Joint pain?: No  Neurological Headaches?: No Dizziness?: No  Psychologic Depression?: No Anxiety?: No  Physical Exam: BP (!) 147/85   Pulse 83   Ht 5\' 6"  (1.676 m)   Wt 230 lb (104.3 kg)   BMI 37.12 kg/m   Constitutional:  Alert and oriented, No acute distress. HEENT: [place] AT, moist mucus membranes.  Trachea midline, no masses. Cardiovascular: No clubbing, cyanosis, or  edema. Respiratory: Normal respiratory effort, no increased work of breathing. GI: Abdomen is soft, nontender, nondistended, no abdominal masses GU: No CVA tenderness. Skin: No rashes, bruises or suspicious lesions. Lymph: No cervical or inguinal adenopathy. Neurologic: Grossly intact, no focal deficits, moving all 4 extremities. Psychiatric: Normal mood and affect.  Laboratory Data: Lab Results  Component Value Date   WBC 6.5 10/05/2017   HGB 13.7 10/05/2017   HCT 40.9 10/05/2017   MCV 85.7 10/05/2017   PLT 281 10/05/2017    Lab Results  Component Value Date   CREATININE 1.20 10/05/2017    Urinalysis Lab Results  Component Value Date   APPEARANCEUR CLEAR 10/02/2017   LEUKOCYTESUR NEGATIVE 10/02/2017   PROTEINUR NEGATIVE 10/02/2017   GLUCOSEU NEGATIVE 10/02/2017   RBCU 0-5 10/02/2017   BILIRUBINUR NEGATIVE 10/02/2017   NITRITE NEGATIVE 10/02/2017    Lab Results  Component Value Date   BACTERIA NONE SEEN 10/02/2017    Pertinent Imaging: I was unable to review his CT as PACS was down  CT Renal Stone Study   Narrative CLINICAL DATA:  Low back pain for 1 month.  Kidney stone.  EXAM: CT ABDOMEN AND PELVIS WITHOUT CONTRAST  TECHNIQUE: Multidetector CT imaging of the abdomen and pelvis was performed following the standard protocol without IV contrast.  COMPARISON:  CT 3 10 16   FINDINGS: Lower chest: Lung bases are clear.  Hepatobiliary: No focal hepatic lesion. No biliary duct dilatation. Gallbladder is normal. Common bile duct is normal.  Pancreas: Pancreas is normal. No ductal dilatation. No pancreatic inflammation.  Spleen: Normal spleen  Adrenals/urinary tract: Adrenal glands are normal.  Nonobstructing calculus in the mid LEFT kidney measures 4 mm. No ureterolithiasis or obstructive uropathy. No bladder calculi.  Stomach/Bowel: Stomach, small bowel, appendix, and cecum are normal. The colon and rectosigmoid colon are  normal.  Vascular/Lymphatic: Abdominal aorta is normal caliber. No periportal or retroperitoneal adenopathy. No pelvic adenopathy.  Reproductive: Prostate gland mildly enlarged.  Other: No free fluid.  Musculoskeletal: No aggressive osseous lesion.  IMPRESSION: 1. Nonobstructing LEFT renal calculus. 2. No ureterolithiasis or obstructive uropathy. 3. No explanation for back pain.   Electronically Signed   By: Genevive BiStewart  Edmunds M.D.   On: 10/05/2017 21:14     Assessment & Plan:   1.  Nephrolithiasis He has a nonobstructing 4 mm left renal calculus and was informed that this would not be a source of his pain.  His symptoms are more suspicious for musculoskeletal etiology and have significantly improved.  I discussed  management options including observation and shockwave lithotripsy.  A KUB was ordered today and he will be notified with results.  If he desires observation would recommend a follow-up visit and KUB in 6 months.  - Urinalysis, Complete - Abdomen 1 view (KUB); Future   2.  Lower urinary tract symptoms Significant improvement on tamsulosin which he desired to continue.   Return in about 6 months (around 04/16/2018) for KUB.  Abbie Sons, [place] [address] [place], [place] [address] [phone]

## 2017-10-18 ENCOUNTER — Telehealth: Payer: Self-pay | Admitting: Family Medicine

## 2017-10-18 NOTE — Telephone Encounter (Signed)
-----   Message from Riki AltesScott C Stoioff, MD sent at 10/18/2017 10:04 AM EST ----- The left renal calculus is visualized on KUB.  We discussed options at yesterday's visit including observation or shockwave lithotripsy.  If he desires observation would recommend a follow-up visit and KUB in 6 months.

## 2017-10-18 NOTE — Telephone Encounter (Signed)
Patient notified and will just do Observation at this time.

## 2018-02-28 DIAGNOSIS — Z23 Encounter for immunization: Secondary | ICD-10-CM | POA: Diagnosis not present

## 2018-02-28 DIAGNOSIS — E119 Type 2 diabetes mellitus without complications: Secondary | ICD-10-CM | POA: Diagnosis not present

## 2018-02-28 DIAGNOSIS — I10 Essential (primary) hypertension: Secondary | ICD-10-CM | POA: Diagnosis not present

## 2018-02-28 DIAGNOSIS — L210 Seborrhea capitis: Secondary | ICD-10-CM | POA: Diagnosis not present

## 2018-04-16 ENCOUNTER — Other Ambulatory Visit: Payer: Self-pay

## 2018-04-16 ENCOUNTER — Ambulatory Visit
Admission: EM | Admit: 2018-04-16 | Discharge: 2018-04-16 | Disposition: A | Discharge status: Home / Self Care | Payer: 59 | Attending: Family Medicine | Admitting: Family Medicine

## 2018-04-16 DIAGNOSIS — H6502 Acute serous otitis media, left ear: Secondary | ICD-10-CM | POA: Diagnosis not present

## 2018-04-16 MED ORDER — AMOXICILLIN-POT CLAVULANATE 875-125 MG PO TABS: 1.0000 | ORAL_TABLET | Freq: Two times a day (BID) | ORAL | 0 refills | Status: DC

## 2018-04-16 NOTE — ED Provider Notes (Signed)
[facility]    CSN: [id number] Arrival date & time: 04/16/18  1125  History   Chief Complaint Chief Complaint  Patient presents with  . Ear Fullness   HPI  57 year old male presents with ear fullness.  Patient states that he is recently had a cold.  He states that he has had ongoing issues with his left ear.  Decreased hearing and pressure/pain.  Worsening.  No improvement with Sudafed.  Associated pain around the ear.  He feels that it has been exacerbated by his cold.  No relieving factors.  No fever.  No other associated symptoms.  No other complaints.  PMH, Surgical Hx, Family Hx, Social History reviewed and updated as below.  Past Medical History:  Diagnosis Date  . Hypertension   . Type 2 diabetes mellitus [facility](HCC)    Patient Active Problem List   Diagnosis Date Noted  . Nephrolithiasis 10/17/2017  . Lower urinary tract symptoms (LUTS) 10/17/2017   Past Surgical History:  Procedure Laterality Date  . KIDNEY STONE SURGERY    . LITHOTRIPSY     Family History Family History  Problem Relation Age of Onset  . ALS Mother   . Hypertension Father   . Diabetes Father    Social History Social History   Tobacco Use  . Smoking status: Never Smoker  . Smokeless tobacco: Never Used  Substance Use Topics  . Alcohol use: Yes    Comment: rare  . Drug use: No   Allergies   Patient has no known allergies.  Review of Systems Review of Systems  Constitutional: Negative.   HENT: Positive for ear pain.    Physical Exam Triage Vital Signs ED Triage Vitals  Enc Vitals Group     BP 04/16/18 1141 (!) 147/93     Pulse Rate 04/16/18 1141 69     Resp 04/16/18 1141 18     Temp 04/16/18 1141 98 F (36.7 C)     Temp Source 04/16/18 1141 Oral     SpO2 04/16/18 1141 98 %     Weight 04/16/18 1144 240 lb (108.9 kg)     Height 04/16/18 1144 5\' 6"  (1.676 m)     Head Circumference --      Peak Flow --      Pain Score 04/16/18 1143 2     Pain Loc --      Pain Edu?  --      Excl. in GC? --    Updated Vital Signs BP (!) 147/93 (BP Location: Left Arm)   Pulse 69   Temp 98 F (36.7 C) (Oral)   Resp 18   Ht 5\' 6"  (1.676 m)   Wt 108.9 kg   SpO2 98%   BMI 38.74 kg/m   Visual Acuity Right Eye Distance:   Left Eye Distance:   Bilateral Distance:    Right Eye Near:   Left Eye Near:    Bilateral Near:     Physical Exam  Constitutional: He is oriented to person, place, and time. He appears well-developed. No distress.  HENT:  Left TM with effusion.  Right TM normal.  Neck: Neck supple.  Cardiovascular: Normal rate and regular rhythm.  Pulmonary/Chest: Effort normal and breath sounds normal.  Lymphadenopathy:    He has no cervical adenopathy.  Neurological: He is alert and oriented to person, place, and time.  Psychiatric: He has a normal mood and affect. His behavior is normal.  Nursing note and vitals reviewed.  UC Treatments /  Results  Labs (all labs ordered are listed, but only abnormal results are displayed) Labs Reviewed - No data to display  EKG None  Radiology No results found.  Procedures Procedures (including critical care time)  Medications Ordered in UC Medications - No data to display  Initial Impression / Assessment and Plan / UC Course  I have reviewed the triage vital signs and the nursing notes.  Pertinent labs & imaging results that were available during my care of the patient were reviewed by me and considered in my medical decision making (see chart for details).    57 year old male presents with middle ear effusion/serous otitis media.  Treating with Augmentin.  Final Clinical Impressions(s) / UC Diagnoses   Final diagnoses:  Acute serous otitis media of left ear, recurrence not specified   Discharge Instructions   None    ED Prescriptions    Medication Sig Dispense Auth. Provider   amoxicillin-clavulanate (AUGMENTIN) 875-125 MG tablet Take 1 tablet by mouth every 12 (twelve) hours. 14 tablet  Tommie Sams, DO     Controlled Substance Prescriptions [place] Controlled Substance Registry consulted? Not Applicable   Tommie Sams, DO 04/16/18 1242

## 2018-04-16 NOTE — ED Triage Notes (Signed)
Pt with ear fullness after a sinus infection. Left ear worse than right and still has pressure. Mild pain behind the ear

## 2018-04-17 ENCOUNTER — Ambulatory Visit: Payer: 59 | Admitting: Urology

## 2018-05-02 DIAGNOSIS — H6983 Other specified disorders of Eustachian tube, bilateral: Secondary | ICD-10-CM | POA: Diagnosis not present

## 2018-05-02 DIAGNOSIS — H6980 Other specified disorders of Eustachian tube, unspecified ear: Secondary | ICD-10-CM | POA: Diagnosis not present

## 2018-05-22 DIAGNOSIS — H6980 Other specified disorders of Eustachian tube, unspecified ear: Secondary | ICD-10-CM | POA: Diagnosis not present

## 2018-05-22 DIAGNOSIS — H9319 Tinnitus, unspecified ear: Secondary | ICD-10-CM | POA: Diagnosis not present

## 2018-05-22 DIAGNOSIS — H9042 Sensorineural hearing loss, unilateral, left ear, with unrestricted hearing on the contralateral side: Secondary | ICD-10-CM | POA: Diagnosis not present

## 2018-05-31 ENCOUNTER — Other Ambulatory Visit: Payer: Self-pay | Admitting: Radiology

## 2018-05-31 DIAGNOSIS — N200 Calculus of kidney: Secondary | ICD-10-CM

## 2018-06-06 ENCOUNTER — Ambulatory Visit: Payer: 59 | Admitting: Urology

## 2018-06-14 ENCOUNTER — Ambulatory Visit: Payer: 59 | Admitting: Urology

## 2018-06-14 ENCOUNTER — Encounter: Payer: Self-pay | Admitting: Urology

## 2018-06-15 ENCOUNTER — Encounter: Payer: Self-pay | Admitting: Urology

## 2018-10-09 DIAGNOSIS — H40053 Ocular hypertension, bilateral: Secondary | ICD-10-CM | POA: Diagnosis not present

## 2018-11-20 ENCOUNTER — Telehealth: Payer: Self-pay | Admitting: Radiology

## 2018-11-20 DIAGNOSIS — R399 Unspecified symptoms and signs involving the genitourinary system: Secondary | ICD-10-CM

## 2018-11-20 DIAGNOSIS — N200 Calculus of kidney: Secondary | ICD-10-CM

## 2018-11-20 NOTE — Telephone Encounter (Signed)
Patient requests refill of tamsulosin sent to [facility] on [address]

## 2018-11-21 MED ORDER — TAMSULOSIN HCL 0.4 MG PO CAPS: 0.4000 mg | ORAL_CAPSULE | Freq: Every day | ORAL | 3 refills | Status: AC

## 2018-11-21 NOTE — Telephone Encounter (Signed)
Pt given short term supply as he needs an appt but are currently unable to see pts in office due to COVID-19.

## 2018-11-21 NOTE — Addendum Note (Signed)
Addended by: Frankey Shown on: 11/21/2018 02:03 PM   Modules accepted: Orders

## 2019-01-10 DIAGNOSIS — H9313 Tinnitus, bilateral: Secondary | ICD-10-CM | POA: Diagnosis not present

## 2019-01-10 DIAGNOSIS — H9319 Tinnitus, unspecified ear: Secondary | ICD-10-CM | POA: Diagnosis not present

## 2019-02-06 ENCOUNTER — Ambulatory Visit (INDEPENDENT_AMBULATORY_CARE_PROVIDER_SITE_OTHER): Payer: 59

## 2019-02-06 ENCOUNTER — Telehealth: Payer: Self-pay | Admitting: General Practice

## 2019-02-06 ENCOUNTER — Ambulatory Visit
Admission: EM | Admit: 2019-02-06 | Discharge: 2019-02-06 | Disposition: A | Discharge status: Home / Self Care | Payer: 59 | Attending: Emergency Medicine | Admitting: Emergency Medicine

## 2019-02-06 ENCOUNTER — Other Ambulatory Visit: Payer: Self-pay

## 2019-02-06 DIAGNOSIS — B349 Viral infection, unspecified: Secondary | ICD-10-CM

## 2019-02-06 DIAGNOSIS — R05 Cough: Secondary | ICD-10-CM

## 2019-02-06 DIAGNOSIS — R059 Cough, unspecified: Secondary | ICD-10-CM

## 2019-02-06 DIAGNOSIS — J029 Acute pharyngitis, unspecified: Secondary | ICD-10-CM

## 2019-02-06 DIAGNOSIS — Z20822 Contact with and (suspected) exposure to covid-19: Secondary | ICD-10-CM

## 2019-02-06 DIAGNOSIS — Z7189 Other specified counseling: Secondary | ICD-10-CM

## 2019-02-06 LAB — RAPID STREP SCREEN (MED CTR MEBANE ONLY): Streptococcus, Group A Screen (Direct): NEGATIVE

## 2019-02-06 MED ORDER — BENZONATATE 100 MG PO CAPS: 100.0000 mg | ORAL_CAPSULE | Freq: Three times a day (TID) | ORAL | 0 refills | Status: DC | PRN

## 2019-02-06 NOTE — Discharge Instructions (Addendum)
Take medication as prescribed. Rest. Drink plenty of fluids. Monitor self closely.  Please refer to [facility] DHHS guidelines given and follow.  COVID-19 testing.  Remain home until test results received and further directed.  Follow up with your primary care physician this week as needed.  Return to urgent care as needed.  Proceed directly to emergency room for chest pain, shortness of breath, worsening concerns.

## 2019-02-06 NOTE — Telephone Encounter (Signed)
Pt has been scheduled for Covid testing. Pt is scheduled at the [facility] testing site.  Scheduled with pt directly at [facility].  Pt was referred by: Marylene Land, NP

## 2019-02-06 NOTE — Addendum Note (Signed)
Addended by: Denman George on: 02/06/2019 03:43 PM   Modules accepted: Orders

## 2019-02-06 NOTE — ED Provider Notes (Signed)
[facility] ____________________________________________  Time seen: Approximately 1:28 PM  I have reviewed the triage vital signs and the nursing notes.   HISTORY  Chief Complaint No chief complaint on file.   HPI Jonathon Morris C Slinker is a 58 y.o. male presenting for evaluation of 3 days of cough, some nasal congestion and some sore throat.  Patient states 1 day last week he felt like he may have had a fever but denies known fevers.  Sore throat currently mild.  Mild nasal congestion.  Cough does occasionally wake him up at night.  Cough is a dry nonproductive cough.  Works at [employer] and frequently around a lot of people.  Reports he was notified this past week of a possible contact in a COVID-19 patient, but reports that contact would have been at the end of May.  Denies any other known sick contacts or COVID 19 contacts.  Has continued to overall eat and drink well.  States has had some soreness from the cough.  Denies current chest pain.  Occasional shortness of breath that he describes as a winded sensation when he is doing a lot of activity.  Denies any current shortness of breath.  Denies aggravating or alleviating factors otherwise.  No recent sickness.  Reports otherwise doing well.  Dione Housekeeperlmedo, Mario Ernesto, MD: PCP   Past Medical History:  Diagnosis Date  . Hypertension   . Type 2 diabetes mellitus [facility](HCC)     Patient Active Problem List   Diagnosis Date Noted  . Nephrolithiasis 10/17/2017  . Lower urinary tract symptoms (LUTS) 10/17/2017    Past Surgical History:  Procedure Laterality Date  . KIDNEY STONE SURGERY    . LITHOTRIPSY       No current facility-administered medications for this encounter.   Current Outpatient Medications:  .  atorvastatin (LIPITOR) 40 MG tablet, Take by mouth., Disp: , Rfl:  .  finasteride (PROSCAR) 5 MG tablet, Take 5 mg by mouth daily., Disp: , Rfl:  .  losartan (COZAAR) 100 MG tablet, Take 100 mg by mouth daily., Disp: ,  Rfl:  .  metformin (FORTAMET) 500 MG (OSM) 24 hr tablet, Take 500 mg by mouth daily with breakfast., Disp: , Rfl:  .  sildenafil (VIAGRA) 100 MG tablet, Take 100 mg by mouth daily as needed for erectile dysfunction., Disp: , Rfl:  .  tamsulosin (FLOMAX) 0.4 MG CAPS capsule, Take 1 capsule (0.4 mg total) by mouth daily., Disp: 30 capsule, Rfl: 3 .  timolol (TIMOPTIC) 0.5 % ophthalmic solution, 1 drop 2 (two) times daily., Disp: , Rfl:  .  benzonatate (TESSALON PERLES) 100 MG capsule, Take 1 capsule (100 mg total) by mouth 3 (three) times daily as needed for cough., Disp: 15 capsule, Rfl: 0  Allergies Patient has no known allergies.  Family History  Problem Relation Age of Onset  . ALS Mother   . Hypertension Father   . Diabetes Father     Social History Social History   Tobacco Use  . Smoking status: Never Smoker  . Smokeless tobacco: Never Used  Substance Use Topics  . Alcohol use: Yes    Comment: rare  . Drug use: No    Review of Systems Constitutional: Denies known fevers. ENT: Positive sore throat.  Positive nasal congestion. Cardiovascular: Denies chest pain. Respiratory: Denies shortness of breath. Gastrointestinal: No abdominal pain.  No nausea, no vomiting.  No diarrhea.   Musculoskeletal: Negative for back pain. Skin: Negative for rash. Neurological: Negative for focal weakness  or numbness.   ____________________________________________   PHYSICAL EXAM:  VITAL SIGNS: ED Triage Vitals  Enc Vitals Group     BP 02/06/19 1255 (!) 144/80     Pulse Rate 02/06/19 1255 77     Resp 02/06/19 1255 18     Temp 02/06/19 1255 98.5 F (36.9 C)     Temp Source 02/06/19 1255 Oral     SpO2 02/06/19 1255 100 %     Weight 02/06/19 1253 245 lb (111.1 kg)     Height 02/06/19 1253 5\' 6"  (1.676 m)     Head Circumference --      Peak Flow --      Pain Score 02/06/19 1253 0     Pain Loc --      Pain Edu? --      Excl. in GC? --     Constitutional: Alert and oriented.  Well appearing and in no acute distress. Eyes: Conjunctivae are normal. Head: Atraumatic. No sinus tenderness to palpation. No swelling. No erythema.  Ears: no erythema, normal TMs bilaterally.   Nose:No nasal congestion  Mouth/Throat: Mucous membranes are moist. Mild pharyngeal erythema. No tonsillar swelling or exudate.  Neck: No stridor.  No cervical spine tenderness to palpation. Hematological/Lymphatic/Immunilogical: No cervical lymphadenopathy. Cardiovascular: Normal rate, regular rhythm. Grossly normal heart sounds.  Good peripheral circulation. Respiratory: Normal respiratory effort.  No retractions. No wheezes, rales or rhonchi. Good air movement.  Occasional dry cough noted in room. Musculoskeletal: Ambulatory with steady gait.  No lower extremity edema noted bilaterally. Neurologic:  Normal speech and language. No gait instability. Skin:  Skin appears warm, dry and intact. No rash noted. Psychiatric: Mood and affect are normal. Speech and behavior are normal. ___________________________________________   LABS (all labs ordered are listed, but only abnormal results are displayed)  Labs Reviewed  RAPID STREP SCREEN ([facility] ONLY)  CULTURE, GROUP A STREP [facility])    RADIOLOGY  Dg Chest 2 View  Result Date: 02/06/2019 CLINICAL DATA:  Nonproductive cough. Shortness of breath. Possible COVID-19 exposure. EXAM: CHEST - 2 VIEW COMPARISON:  None. FINDINGS: The heart size and mediastinal contours are within normal limits. Both lungs are clear. The visualized skeletal structures are unremarkable. IMPRESSION: Normal exam. Electronically Signed   By: Francene BoyersJames  Maxwell M.D.   On: 02/06/2019 14:05   ____________________________________________   PROCEDURES Procedures    INITIAL IMPRESSION / ASSESSMENT AND PLAN / ED COURSE  Pertinent labs & imaging results that were available during my care of the patient were reviewed by me and considered in my medical decision making (see chart  for details).  well-appearing patient.  No acute distress.  Lungs clear throughout at this time.  Vital signs stable.  Strep negative, will culture. Chest x-ray negative.  Will order COVID-19 test.  PRN Tessalon Perles.  Encourage rest, fluids, supportive care and close monitoring.  [place] DHHS information given and encourage patient to adhere and remain home until testing results received and further directed. Discussed indication, risks and benefits of medications with patient.  Strict reevaluation for any chest pain, shortness of breath or worsening concerns.  Discussed follow up with Primary care physician this week. Discussed follow up and return parameters including no resolution or any worsening concerns. Patient verbalized understanding and agreed to plan.   ____________________________________________   FINAL CLINICAL IMPRESSION(S) / ED DIAGNOSES  Final diagnoses:  Cough  Acute pharyngitis, unspecified etiology  Viral illness  Advice Given About Covid-19 Virus Infection     ED Discharge  Orders         Ordered    benzonatate (TESSALON PERLES) 100 MG capsule  3 times daily PRN     02/06/19 1409           Note: This dictation was prepared with Dragon dictation along with smaller phrase technology. Any transcriptional errors that result from this process are unintentional.         Marylene Land, NP 02/06/19 1416

## 2019-02-06 NOTE — ED Triage Notes (Signed)
Patient complains of Cough that started this week. Patient states that he has shortness of breath and chest tightness. Patient states that he was told on Saturday that he came in to contact with a Covid + patient. Patient states that he was told he can not come back to work until he has a test.

## 2019-02-06 NOTE — Telephone Encounter (Signed)
-----   Message from Marylene Land, NP sent at 02/06/2019  1:32 PM EDT ----- Covid 19 testing

## 2019-02-07 ENCOUNTER — Other Ambulatory Visit: Payer: Self-pay

## 2019-02-07 DIAGNOSIS — Z20822 Contact with and (suspected) exposure to covid-19: Secondary | ICD-10-CM

## 2019-02-08 LAB — NOVEL CORONAVIRUS, NAA: SARS-CoV-2, NAA: NOT DETECTED

## 2019-02-09 ENCOUNTER — Telehealth (HOSPITAL_COMMUNITY): Payer: Self-pay | Admitting: Emergency Medicine

## 2019-02-09 NOTE — Telephone Encounter (Signed)
Your test for COVID-19 was negative.  Please continue good preventive care measures, including:  frequent hand-washing, avoid touching your face, cover coughs/sneezes, stay out of crowds and keep a 6 foot distance from others.  If you develop fever/cough/breathlessness, please stay home for 10 days and until you have had 3 consecutive days with cough/breathlessness improving and without fever (without taking a fever reducer). Go to the nearest hospital ED tent for assessment if fever/cough/breathlessness are severe or illness seems like a threat to life.  Patient contacted and made aware of all results, all questions answered.  Given mychart info to access results.

## 2019-02-10 LAB — CULTURE, GROUP A STREP (THRC)

## 2019-07-27 ENCOUNTER — Other Ambulatory Visit: Payer: Self-pay | Admitting: Urology

## 2019-07-27 DIAGNOSIS — N200 Calculus of kidney: Secondary | ICD-10-CM

## 2019-08-08 ENCOUNTER — Ambulatory Visit
Admission: EM | Admit: 2019-08-08 | Discharge: 2019-08-08 | Disposition: A | Discharge status: Home / Self Care | Payer: 59 | Attending: Family Medicine | Admitting: Family Medicine

## 2019-08-08 ENCOUNTER — Encounter: Payer: Self-pay | Admitting: Emergency Medicine

## 2019-08-08 ENCOUNTER — Other Ambulatory Visit: Payer: Self-pay

## 2019-08-08 DIAGNOSIS — J069 Acute upper respiratory infection, unspecified: Secondary | ICD-10-CM

## 2019-08-08 MED ORDER — BENZONATATE 200 MG PO CAPS: 200.0000 mg | ORAL_CAPSULE | Freq: Three times a day (TID) | ORAL | 0 refills | Status: AC | PRN

## 2019-08-08 NOTE — ED Triage Notes (Signed)
Pt c/o fatigue, cough, body aches. Started about 3 days ago. He has been potentially exposed at work to [employer].

## 2019-08-08 NOTE — ED Provider Notes (Signed)
[facility]    CSN: [id number] Arrival date & time: 08/08/19  1702      History   Chief Complaint Chief Complaint  Patient presents with  . Cough  . Generalized Body Aches    HPI Jonathon Morris is a 58 y.o. male.   58 yo male with a c/o cough, fatigue, body aches for the past 3 days. Denies any fevers, chills, shortness of breath. States possible covid exposure from positive co-worker.      Past Medical History:  Diagnosis Date  . Hypertension   . Type 2 diabetes mellitus [facility])     Patient Active Problem List   Diagnosis Date Noted  . Nephrolithiasis 10/17/2017  . Lower urinary tract symptoms (LUTS) 10/17/2017    Past Surgical History:  Procedure Laterality Date  . KIDNEY STONE SURGERY    . LITHOTRIPSY         Home Medications    Prior to Admission medications   Medication Sig Start Date End Date Taking? Authorizing Provider  finasteride (PROSCAR) 5 MG tablet Take 5 mg by mouth daily.   Yes [provider]  losartan (COZAAR) 100 MG tablet Take 100 mg by mouth daily.   Yes [provider]  metformin (FORTAMET) 500 MG (OSM) 24 hr tablet Take 500 mg by mouth daily with breakfast.   Yes [provider]  sildenafil (VIAGRA) 100 MG tablet Take 100 mg by mouth daily as needed for erectile dysfunction.   Yes [provider]  tamsulosin (FLOMAX) 0.4 MG CAPS capsule Take 1 capsule (0.4 mg total) by mouth daily. 11/21/18  Yes Stoioff, Ronda Fairly, MD  timolol (TIMOPTIC) 0.5 % ophthalmic solution 1 drop 2 (two) times daily.   Yes [provider]  atorvastatin (LIPITOR) 40 MG tablet Take by mouth. 02/28/18 02/28/19  [provider]  benzonatate (TESSALON) 200 MG capsule Take 1 capsule (200 mg total) by mouth 3 (three) times daily as needed for cough. 08/08/19   Norval Gable, MD    Family History Family History  Problem Relation Age of Onset  . ALS Mother   . Hypertension Father   . Diabetes Father      Social History Social History   Tobacco Use  . Smoking status: Never Smoker  . Smokeless tobacco: Never Used  Substance Use Topics  . Alcohol use: Yes    Comment: rare  . Drug use: No     Allergies   Patient has no known allergies.   Review of Systems Review of Systems   Physical Exam Triage Vital Signs ED Triage Vitals  Enc Vitals Group     BP 08/08/19 1727 (!) 152/88     Pulse Rate 08/08/19 1727 65     Resp 08/08/19 1727 18     Temp 08/08/19 1727 98.2 F (36.8 C)     Temp Source 08/08/19 1727 Oral     SpO2 08/08/19 1727 100 %     Weight 08/08/19 1724 235 lb (106.6 kg)     Height 08/08/19 1724 5\' 6"  (1.676 m)     Head Circumference --      Peak Flow --      Pain Score 08/08/19 1723 3     Pain Loc --      Pain Edu? --      Excl. in [place]? --    No data found.  Updated Vital Signs BP (!) 152/88 (BP Location: Left Arm)   Pulse 65   Temp 98.2  F (36.8 C) (Oral)   Resp 18   Ht 5\' 6"  (1.676 m)   Wt 106.6 kg   SpO2 100%   BMI 37.93 kg/m   Visual Acuity Right Eye Distance:   Left Eye Distance:   Bilateral Distance:    Right Eye Near:   Left Eye Near:    Bilateral Near:     Physical Exam Vitals and nursing note reviewed.  Constitutional:      General: He is not in acute distress.    Appearance: He is not toxic-appearing or diaphoretic.  Cardiovascular:     Rate and Rhythm: Normal rate.  Pulmonary:     Effort: Pulmonary effort is normal. No respiratory distress.  Neurological:     Mental Status: He is alert.      UC Treatments / Results  Labs (all labs ordered are listed, but only abnormal results are displayed) Labs Reviewed  NOVEL CORONAVIRUS, NAA (HOSP ORDER, SEND-OUT TO REF LAB; TAT 18-24 HRS)    EKG   Radiology No results found.  Procedures Procedures (including critical care time)  Medications Ordered in UC Medications - No data to display  Initial Impression / Assessment and Plan / UC Course  I have reviewed the  triage vital signs and the nursing notes.  Pertinent labs & imaging results that were available during my care of the patient were reviewed by me and considered in my medical decision making (see chart for details).      Final Clinical Impressions(s) / UC Diagnoses   Final diagnoses:  Viral URI with cough     Discharge Instructions     Rest, fluids, over the counter medications as needed    ED Prescriptions    Medication Sig Dispense Auth. Provider   benzonatate (TESSALON) 200 MG capsule Take 1 capsule (200 mg total) by mouth 3 (three) times daily as needed for cough. 15 capsule , MD     1. diagnosis reviewed with patient 2. rx as per orders above; reviewed possible side effects, interactions, risks and benefits  3. Recommend supportive treatment as above 4. Follow-up prn if symptoms worsen or don't improve   PDMP not reviewed this encounter.   [name], MD 08/08/19 2003

## 2019-08-08 NOTE — Discharge Instructions (Signed)
Rest, fluids, over the counter medications as needed  

## 2019-08-09 LAB — NOVEL CORONAVIRUS, NAA (HOSP ORDER, SEND-OUT TO REF LAB; TAT 18-24 HRS): SARS-CoV-2, NAA: NOT DETECTED

## 2019-09-01 ENCOUNTER — Other Ambulatory Visit: Payer: Self-pay

## 2019-09-01 ENCOUNTER — Ambulatory Visit
Admission: EM | Admit: 2019-09-01 | Discharge: 2019-09-01 | Discharge status: Against Medical Advice | Payer: 59 | Attending: Family Medicine | Admitting: Family Medicine

## 2019-09-01 DIAGNOSIS — Z833 Family history of diabetes mellitus: Secondary | ICD-10-CM | POA: Diagnosis not present

## 2019-09-01 DIAGNOSIS — Z8249 Family history of ischemic heart disease and other diseases of the circulatory system: Secondary | ICD-10-CM | POA: Diagnosis not present

## 2019-09-01 DIAGNOSIS — B349 Viral infection, unspecified: Secondary | ICD-10-CM | POA: Diagnosis present

## 2019-09-01 DIAGNOSIS — R05 Cough: Secondary | ICD-10-CM | POA: Diagnosis not present

## 2019-09-01 DIAGNOSIS — R5383 Other fatigue: Secondary | ICD-10-CM

## 2019-09-01 DIAGNOSIS — Z7984 Long term (current) use of oral hypoglycemic drugs: Secondary | ICD-10-CM | POA: Diagnosis not present

## 2019-09-01 DIAGNOSIS — E119 Type 2 diabetes mellitus without complications: Secondary | ICD-10-CM | POA: Insufficient documentation

## 2019-09-01 DIAGNOSIS — I10 Essential (primary) hypertension: Secondary | ICD-10-CM | POA: Diagnosis not present

## 2019-09-01 DIAGNOSIS — Z20822 Contact with and (suspected) exposure to covid-19: Secondary | ICD-10-CM | POA: Diagnosis not present

## 2019-09-01 DIAGNOSIS — Z79899 Other long term (current) drug therapy: Secondary | ICD-10-CM | POA: Insufficient documentation

## 2019-09-01 DIAGNOSIS — R197 Diarrhea, unspecified: Secondary | ICD-10-CM

## 2019-09-01 NOTE — ED Provider Notes (Signed)
[facility]    CSN: [id number] Arrival date & time: 09/01/19  1533      History   Chief Complaint Chief Complaint  Patient presents with  . covid exposure    HPI Jonathon Morris is a 59 y.o. male.   Patient reported to nurse he was in contact with positive covid employee and patient began having cough, fatigue and diarrhea on Wednesday (4 days ago).  (NOTE: patient left without being seen after 50 minutes).     Past Medical History:  Diagnosis Date  . Hypertension   . Type 2 diabetes mellitus [facility])     Patient Active Problem List   Diagnosis Date Noted  . Nephrolithiasis 10/17/2017  . Lower urinary tract symptoms (LUTS) 10/17/2017    Past Surgical History:  Procedure Laterality Date  . KIDNEY STONE SURGERY    . LITHOTRIPSY         Home Medications    Prior to Admission medications   Medication Sig Start Date End Date Taking? Authorizing Provider  atorvastatin (LIPITOR) 40 MG tablet Take by mouth. 02/28/18 02/28/19  [provider]  benzonatate (TESSALON) 200 MG capsule Take 1 capsule (200 mg total) by mouth 3 (three) times daily as needed for cough. 08/08/19   Norval Gable, MD  finasteride (PROSCAR) 5 MG tablet Take 5 mg by mouth daily.    [provider]  losartan (COZAAR) 100 MG tablet Take 100 mg by mouth daily.    [provider]  metformin (FORTAMET) 500 MG (OSM) 24 hr tablet Take 500 mg by mouth daily with breakfast.    [provider]  sildenafil (VIAGRA) 100 MG tablet Take 100 mg by mouth daily as needed for erectile dysfunction.    [provider]  tamsulosin (FLOMAX) 0.4 MG CAPS capsule Take 1 capsule (0.4 mg total) by mouth daily. 11/21/18   Stoioff, Ronda Fairly, MD  timolol (TIMOPTIC) 0.5 % ophthalmic solution 1 drop 2 (two) times daily.    [provider]    Family History Family History  Problem Relation Age of Onset  . ALS Mother   . Hypertension Father   . Diabetes Father      Social History Social History   Tobacco Use  . Smoking status: Never Smoker  . Smokeless tobacco: Never Used  Substance Use Topics  . Alcohol use: Yes    Comment: rare  . Drug use: No     Allergies   Patient has no known allergies.   Review of Systems Review of Systems   Physical Exam Triage Vital Signs ED Triage Vitals  Enc Vitals Group     BP 09/01/19 1601 (!) 143/83     Pulse Rate 09/01/19 1601 74     Resp 09/01/19 1601 17     Temp 09/01/19 1601 98.3 F (36.8 C)     Temp Source 09/01/19 1601 Oral     SpO2 09/01/19 1601 100 %     Weight 09/01/19 1602 235 lb (106.6 kg)     Height 09/01/19 1602 5\' 6"  (1.676 m)     Head Circumference --      Peak Flow --      Pain Score --      Pain Loc --      Pain Edu? --      Excl. in [place]? --    No data found.  Updated Vital Signs BP (!) 143/83 (BP Location: Right Arm)   Pulse 74   Temp 98.3  F (36.8 C) (Oral)   Resp 17   Ht 5\' 6"  (1.676 m)   Wt 106.6 kg   SpO2 100%   BMI 37.93 kg/m   Visual Acuity Right Eye Distance:   Left Eye Distance:   Bilateral Distance:    Right Eye Near:   Left Eye Near:    Bilateral Near:     Physical Exam Vitals and nursing note reviewed.      UC Treatments / Results  Labs (all labs ordered are listed, but only abnormal results are displayed) Labs Reviewed  NOVEL CORONAVIRUS, NAA (HOSP ORDER, SEND-OUT TO REF LAB; TAT 18-24 HRS)    EKG   Radiology No results found.  Procedures Procedures (including critical care time)  Medications Ordered in UC Medications - No data to display  Initial Impression / Assessment and Plan / UC Course  I have reviewed the triage vital signs and the nursing notes.  Pertinent labs & imaging results that were available during my care of the patient were reviewed by me and considered in my medical decision making (see chart for details).      Final Clinical Impressions(s) / UC Diagnoses   Final diagnoses:  Viral syndrome     ED Prescriptions    None     1. Chart reviewed (nursing note and vital signs) 2. covid test done by RN  3. patient left without being seen by provider (after 50 minutes).   PDMP not reviewed this encounter.   , MD 09/01/19 [phone]

## 2019-09-01 NOTE — ED Triage Notes (Signed)
Pt states he was in contact with an employee who was positive for COVID and then pt began having sx on Wednesday. Cough, fatigue, diarrhea.

## 2019-09-02 LAB — NOVEL CORONAVIRUS, NAA (HOSP ORDER, SEND-OUT TO REF LAB; TAT 18-24 HRS): SARS-CoV-2, NAA: NOT DETECTED

## 2019-09-09 ENCOUNTER — Other Ambulatory Visit: Payer: Self-pay

## 2019-09-09 ENCOUNTER — Ambulatory Visit
Admission: EM | Admit: 2019-09-09 | Discharge: 2019-09-09 | Disposition: A | Discharge status: Home / Self Care | Payer: 59 | Attending: Family Medicine | Admitting: Family Medicine

## 2019-09-09 DIAGNOSIS — Z20822 Contact with and (suspected) exposure to covid-19: Secondary | ICD-10-CM | POA: Diagnosis not present

## 2019-09-09 DIAGNOSIS — R0981 Nasal congestion: Secondary | ICD-10-CM

## 2019-09-09 NOTE — ED Provider Notes (Signed)
[facility]    CSN: [id number] Arrival date & time: 09/09/19  1040      History   Chief Complaint Chief Complaint  Patient presents with  . COVID exposure    HPI Jonathon Morris is a 59 y.o. male.   59 yo male with a c/o possible exposure to covid at work last week. Patient states he had some slight diarrhea 4 days ago but that has now resolved and now has only some slight nasal congestion. Denies any other symptoms. Denies any fevers, chills, vomiting, cough, or shortness of breath.      Past Medical History:  Diagnosis Date  . Hypertension   . Type 2 diabetes mellitus [facility])     Patient Active Problem List   Diagnosis Date Noted  . Nephrolithiasis 10/17/2017  . Lower urinary tract symptoms (LUTS) 10/17/2017    Past Surgical History:  Procedure Laterality Date  . KIDNEY STONE SURGERY    . LITHOTRIPSY         Home Medications    Prior to Admission medications   Medication Sig Start Date End Date Taking? Authorizing Provider  atorvastatin (LIPITOR) 40 MG tablet Take by mouth. 02/28/18 09/09/19 Yes [provider]  benzonatate (TESSALON) 200 MG capsule Take 1 capsule (200 mg total) by mouth 3 (three) times daily as needed for cough. 08/08/19  Yes Norval Gable, MD  finasteride (PROSCAR) 5 MG tablet Take 5 mg by mouth daily.   Yes [provider]  losartan (COZAAR) 100 MG tablet Take 100 mg by mouth daily.   Yes [provider]  metformin (FORTAMET) 500 MG (OSM) 24 hr tablet Take 500 mg by mouth daily with breakfast.   Yes [provider]  sildenafil (VIAGRA) 100 MG tablet Take 100 mg by mouth daily as needed for erectile dysfunction.   Yes [provider]  tamsulosin (FLOMAX) 0.4 MG CAPS capsule Take 1 capsule (0.4 mg total) by mouth daily. 11/21/18  Yes Stoioff, Ronda Fairly, MD  timolol (TIMOPTIC) 0.5 % ophthalmic solution 1 drop 2 (two) times daily.   Yes [provider]    Family History Family  History  Problem Relation Age of Onset  . ALS Mother   . Hypertension Father   . Diabetes Father     Social History Social History   Tobacco Use  . Smoking status: Never Smoker  . Smokeless tobacco: Never Used  Substance Use Topics  . Alcohol use: Yes    Comment: rare  . Drug use: No     Allergies   Patient has no known allergies.   Review of Systems Review of Systems   Physical Exam Triage Vital Signs ED Triage Vitals  Enc Vitals Group     BP 09/09/19 1056 (!) 142/89     Pulse Rate 09/09/19 1056 83     Resp --      Temp 09/09/19 1056 98.4 F (36.9 C)     Temp Source 09/09/19 1056 Oral     SpO2 09/09/19 1056 100 %     Weight 09/09/19 1054 235 lb (106.6 kg)     Height 09/09/19 1054 5\' 6"  (1.676 m)     Head Circumference --      Peak Flow --      Pain Score 09/09/19 1054 0     Pain Loc --      Pain Edu? --      Excl. in [place]? --    No data found.  Updated  Vital Signs BP (!) 142/89 (BP Location: Left Arm)   Pulse 83   Temp 98.4 F (36.9 C) (Oral)   Ht 5\' 6"  (1.676 m)   Wt 106.6 kg   SpO2 100%   BMI 37.93 kg/m   Visual Acuity Right Eye Distance:   Left Eye Distance:   Bilateral Distance:    Right Eye Near:   Left Eye Near:    Bilateral Near:     Physical Exam Vitals and nursing note reviewed.  Constitutional:      General: He is not in acute distress.    Appearance: Normal appearance. He is not toxic-appearing or diaphoretic.  Cardiovascular:     Rate and Rhythm: Normal rate.  Pulmonary:     Effort: Pulmonary effort is normal. No respiratory distress.  Neurological:     Mental Status: He is alert.      UC Treatments / Results  Labs (all labs ordered are listed, but only abnormal results are displayed) Labs Reviewed  NOVEL CORONAVIRUS, NAA (HOSP ORDER, SEND-OUT TO REF LAB; TAT 18-24 HRS)    EKG   Radiology No results found.  Procedures Procedures (including critical care time)  Medications Ordered in UC Medications - No  data to display  Initial Impression / Assessment and Plan / UC Course  I have reviewed the triage vital signs and the nursing notes.  Pertinent labs & imaging results that were available during my care of the patient were reviewed by me and considered in my medical decision making (see chart for details).      Final Clinical Impressions(s) / UC Diagnoses   Final diagnoses:  Exposure to COVID-19 virus  Nasal congestion     Discharge Instructions     Rest, fluids, over the counter medications as needed    ED Prescriptions    None      1. diagnosis reviewed with patient 2. covid test done 3. Recommend supportive treatment as above 4. Follow-up prn if symptoms worsen or don't improve   PDMP not reviewed this encounter.   , MD 09/09/19 1122

## 2019-09-09 NOTE — ED Triage Notes (Signed)
Pt presents with c/o workplace exposure to a COVID positive employee, pt does not know if he works directly with this person as they have not advised who it is. Pt was only told he had been around this person at least 15 minutes during the work week. Pt was advised of this information yesterday. Pt does report some nasal congestion currently and some diarrhea this past Thursday that has resolved. He denies cough, f/n/v or other symptoms.

## 2019-09-09 NOTE — Discharge Instructions (Signed)
Rest, fluids, over the counter medications as needed  

## 2019-09-11 LAB — NOVEL CORONAVIRUS, NAA (HOSP ORDER, SEND-OUT TO REF LAB; TAT 18-24 HRS): SARS-CoV-2, NAA: NOT DETECTED

## 2019-11-15 ENCOUNTER — Ambulatory Visit: Payer: 59 | Attending: Internal Medicine

## 2019-11-15 DIAGNOSIS — Z23 Encounter for immunization: Secondary | ICD-10-CM

## 2019-11-15 NOTE — Progress Notes (Signed)
   Covid-19 Vaccination Clinic  Name:  PURNELL DAIGLE    MRN: [id number] DOB: 08-04-1961  11/15/2019  Mr. Labelle was observed post Covid-19 immunization for 15 minutes without incident. He was provided with Vaccine Information Sheet and instruction to access the V-Safe system.   Mr. Wittmeyer was instructed to call 911 with any severe reactions post vaccine: Marland Kitchen Difficulty breathing  . Swelling of face and throat  . A fast heartbeat  . A bad rash all over body  . Dizziness and weakness   Immunizations Administered    Name Date Dose VIS Date Route   Pfizer COVID-19 Vaccine 11/15/2019 12:55 PM 0.3 mL 08/03/2019 Intramuscular   Manufacturer: [employer], [employer]   Lot: [id number]   NDC: [address]-8

## 2019-11-21 ENCOUNTER — Other Ambulatory Visit: Payer: Self-pay

## 2019-11-21 ENCOUNTER — Encounter: Payer: Self-pay | Admitting: Emergency Medicine

## 2019-11-21 ENCOUNTER — Ambulatory Visit
Admission: EM | Admit: 2019-11-21 | Discharge: 2019-11-21 | Disposition: A | Discharge status: Home / Self Care | Payer: 59 | Attending: Family Medicine | Admitting: Family Medicine

## 2019-11-21 DIAGNOSIS — X500XXA Overexertion from strenuous movement or load, initial encounter: Secondary | ICD-10-CM | POA: Diagnosis not present

## 2019-11-21 DIAGNOSIS — S161XXA Strain of muscle, fascia and tendon at neck level, initial encounter: Secondary | ICD-10-CM | POA: Diagnosis not present

## 2019-11-21 DIAGNOSIS — S39012A Strain of muscle, fascia and tendon of lower back, initial encounter: Secondary | ICD-10-CM

## 2019-11-21 MED ORDER — CYCLOBENZAPRINE HCL 10 MG PO TABS: 10.0000 mg | ORAL_TABLET | Freq: Three times a day (TID) | ORAL | 0 refills | Status: AC | PRN

## 2019-11-21 MED ORDER — MELOXICAM 15 MG PO TABS: 15.0000 mg | ORAL_TABLET | Freq: Every day | ORAL | 0 refills | Status: DC

## 2019-11-21 NOTE — ED Triage Notes (Signed)
Patient c/o back and neck pain x 3 days. He has chronic issues with his back and neck but states he has been over doing it with lifting.

## 2019-11-21 NOTE — ED Provider Notes (Signed)
[facility]    CSN: [id number] Arrival date & time: 11/21/19  0855      History   Chief Complaint Chief Complaint  Patient presents with  . Back Pain  . Neck Pain    HPI Jonathon Morris is a 59 y.o. male.   59 yo male with a c/o neck/upper back pain and low back pain for the past 3-4 days. States he has intermittent, chronic issues with his back, however states last week for almost fell off a ladder. States he was helped as he was falling so he did not hit the ground or anything else but felt like he strained his muscles.    Back Pain Neck Pain   Past Medical History:  Diagnosis Date  . Hypertension   . Type 2 diabetes mellitus [facility])     Patient Active Problem List   Diagnosis Date Noted  . Nephrolithiasis 10/17/2017  . Lower urinary tract symptoms (LUTS) 10/17/2017    Past Surgical History:  Procedure Laterality Date  . KIDNEY STONE SURGERY    . LITHOTRIPSY         Home Medications    Prior to Admission medications   Medication Sig Start Date End Date Taking? Authorizing Provider  atorvastatin (LIPITOR) 40 MG tablet Take by mouth. 02/28/18 11/21/19 Yes [provider]  finasteride (PROSCAR) 5 MG tablet Take 5 mg by mouth daily.   Yes [provider]  losartan (COZAAR) 100 MG tablet Take 100 mg by mouth daily.   Yes [provider]  metformin (FORTAMET) 500 MG (OSM) 24 hr tablet Take 500 mg by mouth daily with breakfast.   Yes [provider]  tamsulosin (FLOMAX) 0.4 MG CAPS capsule Take 1 capsule (0.4 mg total) by mouth daily. 11/21/18  Yes Stoioff, Ronda Fairly, MD  timolol (TIMOPTIC) 0.5 % ophthalmic solution 1 drop 2 (two) times daily.   Yes [provider]  benzonatate (TESSALON) 200 MG capsule Take 1 capsule (200 mg total) by mouth 3 (three) times daily as needed for cough. 08/08/19   Norval Gable, MD  cyclobenzaprine (FLEXERIL) 10 MG tablet Take 1 tablet (10 mg total) by mouth 3 (three) times daily as  needed for muscle spasms. 11/21/19   Norval Gable, MD  meloxicam (MOBIC) 15 MG tablet Take 1 tablet (15 mg total) by mouth daily. 11/21/19   Norval Gable, MD  sildenafil (VIAGRA) 100 MG tablet Take 100 mg by mouth daily as needed for erectile dysfunction.    [provider]    Family History Family History  Problem Relation Age of Onset  . ALS Mother   . Hypertension Father   . Diabetes Father     Social History Social History   Tobacco Use  . Smoking status: Never Smoker  . Smokeless tobacco: Never Used  Substance Use Topics  . Alcohol use: Yes    Comment: rare  . Drug use: No     Allergies   Patient has no known allergies.   Review of Systems Review of Systems  Musculoskeletal: Positive for back pain and neck pain.     Physical Exam Triage Vital Signs ED Triage Vitals  Enc Vitals Group     BP 11/21/19 0916 (!) 145/77     Pulse Rate 11/21/19 0916 83     Resp 11/21/19 0916 18     Temp 11/21/19 0916 98.6 F (37 C)     Temp Source 11/21/19 0916 Oral     SpO2 11/21/19 0916  99 %     Weight 11/21/19 0914 239 lb (108.4 kg)     Height 11/21/19 0914 5\' 6"  (1.676 m)     Head Circumference --      Peak Flow --      Pain Score 11/21/19 0914 7     Pain Loc --      Pain Edu? --      Excl. in [place]? --    No data found.  Updated Vital Signs BP (!) 145/77 (BP Location: Right Arm)   Pulse 83   Temp 98.6 F (37 C) (Oral)   Resp 18   Ht 5\' 6"  (1.676 m)   Wt 108.4 kg   SpO2 99%   BMI 38.58 kg/m   Visual Acuity Right Eye Distance:   Left Eye Distance:   Bilateral Distance:    Right Eye Near:   Left Eye Near:    Bilateral Near:     Physical Exam Vitals and nursing note reviewed.  Constitutional:      General: He is not in acute distress.    Appearance: He is not toxic-appearing or diaphoretic.  Eyes:     Extraocular Movements: Extraocular movements intact.  Musculoskeletal:     Cervical back: Neck supple. Spasms present. No swelling, edema,  deformity, erythema, signs of trauma, lacerations, rigidity, torticollis, tenderness (over the right trapezius and paraspinous muscles), bony tenderness or crepitus. No pain with movement. Normal range of motion.     Lumbar back: Spasms and tenderness (paraspinous muscles) present. No swelling, edema, deformity, signs of trauma, lacerations or bony tenderness. Normal range of motion. Negative right straight leg raise test and negative left straight leg raise test. No scoliosis.  Neurological:     General: No focal deficit present.     Mental Status: He is alert.     Deep Tendon Reflexes: Reflexes normal.      UC Treatments / Results  Labs (all labs ordered are listed, but only abnormal results are displayed) Labs Reviewed - No data to display  EKG   Radiology No results found.  Procedures Procedures (including critical care time)  Medications Ordered in UC Medications - No data to display  Initial Impression / Assessment and Plan / UC Course  I have reviewed the triage vital signs and the nursing notes.  Pertinent labs & imaging results that were available during my care of the patient were reviewed by me and considered in my medical decision making (see chart for details).      Final Clinical Impressions(s) / UC Diagnoses   Final diagnoses:  Strain of lumbar region, initial encounter  Strain of neck muscle, initial encounter     Discharge Instructions     Rest, heat, over the counter tylenol as needed    ED Prescriptions    Medication Sig Dispense Auth. Provider   meloxicam (MOBIC) 15 MG tablet Take 1 tablet (15 mg total) by mouth daily. 30 tablet 11/23/19, MD   cyclobenzaprine (FLEXERIL) 10 MG tablet Take 1 tablet (10 mg total) by mouth 3 (three) times daily as needed for muscle spasms. 30 tablet , MD      1. diagnosis reviewed with patient 2. rx as per orders above; reviewed possible side effects, interactions, risks and benefits  3.  Recommend supportive treatment rest, heat, otc analgesic 4. Follow-up prn if symptoms worsen or don't improve  I have reviewed the PDMP during this encounter.   Jonathon Mccallum, MD 11/21/19 1253

## 2019-11-21 NOTE — Discharge Instructions (Signed)
Rest, heat, over the counter tylenol as needed

## 2019-12-12 ENCOUNTER — Ambulatory Visit: Payer: 59 | Attending: Internal Medicine

## 2019-12-12 DIAGNOSIS — Z23 Encounter for immunization: Secondary | ICD-10-CM

## 2019-12-12 NOTE — Progress Notes (Signed)
   Covid-19 Vaccination Clinic  Name:  Jonathon Morris    MRN: [id number] DOB: 07-28-1961  12/12/2019  [name] was observed post Covid-19 immunization for 15 minutes without incident. He was provided with Vaccine Information Sheet and instruction to access the V-Safe system.   [name] was instructed to call 911 with any severe reactions post vaccine: Marland Kitchen Difficulty breathing  . Swelling of face and throat  . A fast heartbeat  . A bad rash all over body  . Dizziness and weakness   Immunizations Administered    Name Date Dose VIS Date Route   Pfizer COVID-19 Vaccine 12/12/2019  8:54 AM 0.3 mL 10/17/2018 Intramuscular   Manufacturer: [employer], [employer]   Lot: [id number]   NDC: [address]-8

## 2020-08-02 ENCOUNTER — Other Ambulatory Visit: Payer: Self-pay

## 2020-08-02 ENCOUNTER — Encounter: Payer: Self-pay | Admitting: Emergency Medicine

## 2020-08-02 ENCOUNTER — Ambulatory Visit
Admission: EM | Admit: 2020-08-02 | Discharge: 2020-08-02 | Disposition: A | Discharge status: Home / Self Care | Payer: Managed Care, Other (non HMO) | Attending: Physician Assistant | Admitting: Physician Assistant

## 2020-08-02 DIAGNOSIS — R059 Cough, unspecified: Secondary | ICD-10-CM | POA: Diagnosis not present

## 2020-08-02 DIAGNOSIS — R197 Diarrhea, unspecified: Secondary | ICD-10-CM | POA: Diagnosis not present

## 2020-08-02 DIAGNOSIS — U071 COVID-19: Secondary | ICD-10-CM | POA: Insufficient documentation

## 2020-08-02 DIAGNOSIS — R5383 Other fatigue: Secondary | ICD-10-CM | POA: Insufficient documentation

## 2020-08-02 LAB — RESP PANEL BY RT-PCR (FLU A&B, COVID) ARPGX2
Influenza A by PCR: NEGATIVE
Influenza B by PCR: NEGATIVE
SARS Coronavirus 2 by RT PCR: POSITIVE — AB

## 2020-08-02 MED ORDER — PSEUDOEPH-BROMPHEN-DM 30-2-10 MG/5ML PO SYRP: 10.0000 mL | ORAL_SOLUTION | Freq: Four times a day (QID) | ORAL | 0 refills | Status: AC | PRN

## 2020-08-02 NOTE — ED Triage Notes (Signed)
Patient c/o cough, chest congestion, runny nose, and bodyaches and headache that started on Wed.  Patient reports fever on Wed.

## 2020-08-02 NOTE — Discharge Instructions (Signed)
Your COVID-19 test was positive today.  You will need to isolate 10 days from symptom onset.  Care is largely supportive, but I cannot call the infusion clinic and see if they are able to get you in for MAB therapy.  If symptomatic, go home and rest. Push fluids. Take Tylenol as needed for discomfort. Gargle warm salt water. Throat lozenges. Take Mucinex DM or Robitussin for cough. Humidifier in bedroom to ease coughing. Warm showers. Also review the COVID handout for more information.  COVID-19 INFECTION: The incubation period of COVID-19 is approximately 14 days after exposure, with most symptoms developing in roughly 4-5 days. Symptoms may range in severity from mild to critically severe. Roughly 80% of those infected will have mild symptoms. People of any age may become infected with COVID-19 and have the ability to transmit the virus. The most common symptoms include: fever, fatigue, cough, body aches, headaches, sore throat, nasal congestion, shortness of breath, nausea, vomiting, diarrhea, changes in smell and/or taste.    COURSE OF ILLNESS Some patients may begin with mild disease which can progress quickly into critical symptoms. If your symptoms are worsening please call ahead to the Emergency Department and proceed there for further treatment. Recovery time appears to be roughly 1-2 weeks for mild symptoms and 3-6 weeks for severe disease.   GO IMMEDIATELY TO ER FOR FEVER YOU ARE UNABLE TO GET DOWN WITH TYLENOL, BREATHING PROBLEMS, CHEST PAIN, FATIGUE, LETHARGY, INABILITY TO EAT OR DRINK, ETC  QUARANTINE AND ISOLATION: To help decrease the spread of COVID-19 please remain isolated if you have COVID infection or are highly suspected to have COVID infection. This means -stay home and isolate to one room in the home if you live with others. Do not share a bed or bathroom with others while ill, sanitize and wipe down all countertops and keep common areas clean and disinfected. You may discontinue  isolation if you have a mild case and are asymptomatic 10 days after symptom onset as long as you have been fever free >24 hours without having to take Motrin or Tylenol. If your case is more severe (meaning you develop pneumonia or are admitted in the hospital), you may have to isolate longer.   If you have been in close contact (within 6 feet) of someone diagnosed with COVID 19, you are advised to quarantine in your home for 14 days as symptoms can develop anywhere from 2-14 days after exposure to the virus. If you develop symptoms, you  must isolate.  Most current guidelines for COVID after exposure -isolate 10 days if you ARE NOT tested for COVID as long as symptoms do not develop -isolate 7 days if you are tested and remain asymptomatic -You do not necessarily need to be tested for COVID if you have + exposure and        develop   symptoms. Just isolate at home x10 days from symptom onset During this global pandemic, CDC advises to practice social distancing, try to stay at least 39ft away from others at all times. Wear a face covering. Wash and sanitize your hands regularly and avoid going anywhere that is not necessary.  KEEP IN MIND THAT THE COVID TEST IS NOT 100% ACCURATE AND YOU SHOULD STILL DO EVERYTHING TO PREVENT POTENTIAL SPREAD OF VIRUS TO OTHERS (WEAR MASK, WEAR GLOVES, WASH HANDS AND SANITIZE REGULARLY). IF INITIAL TEST IS NEGATIVE, THIS MAY NOT MEAN YOU ARE DEFINITELY NEGATIVE. MOST ACCURATE TESTING IS DONE 5-7 DAYS AFTER EXPOSURE.   It  is not advised by CDC to get re-tested after receiving a positive COVID test since you can still test positive for weeks to months after you have already cleared the virus.   *If you have not been vaccinated for COVID, I strongly suggest you consider getting vaccinated as long as there are no contraindications.

## 2020-08-02 NOTE — ED Provider Notes (Signed)
[facility]    CSN: [id number] Arrival date & time: 08/02/20  1131      History   Chief Complaint Chief Complaint  Patient presents with   Cough   Diarrhea   Generalized Body Aches    HPI Jonathon Morris is a 59 y.o. male presenting for 3-day history of cough, congestion, runny nose, sore throat, diarrhea, and body aches.  He also admits to headache.  Patient states that he had a fever on Wednesday but it has resolved.  He states that he has been getting a little bit short of breath when he walks.  Denies any history of asthma.  He denies any known COVID-19 exposure and has been fully vaccinated for COVID-19.  Past medical history is significant for hypertension and type 2 diabetes.  Patient states he has been taking over-the-counter cough/cold medication for symptoms.  He has no other complaints or concerns at this time.  HPI  Past Medical History:  Diagnosis Date   Hypertension    Type 2 diabetes mellitus (HCC)     Patient Active Problem List   Diagnosis Date Noted   Nephrolithiasis 10/17/2017   Lower urinary tract symptoms (LUTS) 10/17/2017    Past Surgical History:  Procedure Laterality Date   KIDNEY STONE SURGERY     LITHOTRIPSY         Home Medications    Prior to Admission medications   Medication Sig Start Date End Date Taking? Authorizing Provider  atorvastatin (LIPITOR) 40 MG tablet Take by mouth. 02/28/18 08/02/20 Yes [provider]  finasteride (PROSCAR) 5 MG tablet Take 5 mg by mouth daily.   Yes [provider]  losartan (COZAAR) 100 MG tablet Take 100 mg by mouth daily.   Yes [provider]  metformin (FORTAMET) 500 MG (OSM) 24 hr tablet Take 500 mg by mouth daily with breakfast.   Yes [provider]  tamsulosin (FLOMAX) 0.4 MG CAPS capsule Take 1 capsule (0.4 mg total) by mouth daily. 11/21/18  Yes Stoioff, Verna Czech, MD  timolol (TIMOPTIC) 0.5 % ophthalmic solution 1 drop 2 (two) times daily.    Yes [provider]  benzonatate (TESSALON) 200 MG capsule Take 1 capsule (200 mg total) by mouth 3 (three) times daily as needed for cough. 08/08/19   [name], MD  brompheniramine-pseudoephedrine-DM 30-2-10 MG/5ML syrup Take 10 mLs by mouth 4 (four) times daily as needed for up to 7 days. 08/02/20 08/09/20  Eusebio Friendly B, PA-C  cyclobenzaprine (FLEXERIL) 10 MG tablet Take 1 tablet (10 mg total) by mouth 3 (three) times daily as needed for muscle spasms. 11/21/19   [name], MD  meloxicam (MOBIC) 15 MG tablet Take 1 tablet (15 mg total) by mouth daily. 11/21/19   [name], MD  sildenafil (VIAGRA) 100 MG tablet Take 100 mg by mouth daily as needed for erectile dysfunction.    [provider]    Family History Family History  Problem Relation Age of Onset   ALS Mother    Hypertension Father    Diabetes Father     Social History Social History   Tobacco Use   Smoking status: Never Smoker   Smokeless tobacco: Never Used  Building services engineer Use: Never used  Substance Use Topics   Alcohol use: Yes    Comment: rare   Drug use: No     Allergies   Patient has no known allergies.   Review of Systems Review of Systems  Constitutional: Positive for fatigue. Negative for fever.  HENT: Positive for congestion, rhinorrhea and sore throat. Negative for sinus pressure and sinus pain.   Respiratory: Positive for cough and shortness of breath. Negative for wheezing.   Cardiovascular: Negative for chest pain.  Gastrointestinal: Positive for diarrhea. Negative for abdominal pain, nausea and vomiting.  Musculoskeletal: Positive for myalgias.  Neurological: Positive for headaches. Negative for weakness and light-headedness.  Hematological: Negative for adenopathy.     Physical Exam Triage Vital Signs ED Triage Vitals  Enc Vitals Group     BP 08/02/20 1232 (!) 147/87     Pulse Rate 08/02/20 1232 76     Resp 08/02/20 1232 16     Temp  08/02/20 1232 98.3 F (36.8 C)     Temp Source 08/02/20 1232 Oral     SpO2 08/02/20 1232 99 %     Weight 08/02/20 1228 240 lb (108.9 kg)     Height 08/02/20 1228 5\' 6"  (1.676 m)     Head Circumference --      Peak Flow --      Pain Score 08/02/20 1228 5     Pain Loc --      Pain Edu? --      Excl. in GC? --    No data found.  Updated Vital Signs BP (!) 147/87 (BP Location: Left Arm)    Pulse 76    Temp 98.3 F (36.8 C) (Oral)    Resp 16    Ht 5\' 6"  (1.676 m)    Wt 240 lb (108.9 kg)    SpO2 99%    BMI 38.74 kg/m       Physical Exam Vitals and nursing note reviewed.  Constitutional:      General: He is not in acute distress.    Appearance: Normal appearance. He is well-developed and well-nourished. He is obese. He is ill-appearing. He is not toxic-appearing or diaphoretic.  HENT:     Head: Normocephalic and atraumatic.     Nose: Congestion and rhinorrhea present.     Mouth/Throat:     Mouth: Mucous membranes are normal. Mucous membranes are moist.     Pharynx: Oropharynx is clear. Uvula midline. Posterior oropharyngeal erythema present. No oropharyngeal exudate.     Tonsils: No tonsillar abscesses.  Eyes:     General: No scleral icterus.       Right eye: No discharge.        Left eye: No discharge.     Extraocular Movements: EOM normal.     Conjunctiva/sclera: Conjunctivae normal.  Neck:     Thyroid: No thyromegaly.     Trachea: No tracheal deviation.  Cardiovascular:     Rate and Rhythm: Normal rate and regular rhythm.     Heart sounds: Normal heart sounds.  Pulmonary:     Effort: Pulmonary effort is normal. No respiratory distress.     Breath sounds: Normal breath sounds. No wheezing, rhonchi or rales.  Musculoskeletal:     Cervical back: Neck supple.  Lymphadenopathy:     Cervical: No cervical adenopathy.  Skin:    General: Skin is warm and dry.     Findings: No rash.  Neurological:     General: No focal deficit present.     Mental Status: He is alert.  Mental status is at baseline.     Motor: No weakness.  Psychiatric:        Mood and Affect: Mood normal.        Behavior:  Behavior normal.        Thought Content: Thought content normal.      UC Treatments / Results  Labs (all labs ordered are listed, but only abnormal results are displayed) Labs Reviewed  RESP PANEL BY RT-PCR (FLU A&B, COVID) ARPGX2 - Abnormal; Notable for the following components:      Result Value   SARS Coronavirus 2 by RT PCR POSITIVE (*)    All other components within normal limits    EKG   Radiology No results found.  Procedures Procedures (including critical care time)  Medications Ordered in UC Medications - No data to display  Initial Impression / Assessment and Plan / UC Course  I have reviewed the triage vital signs and the nursing notes.  Pertinent labs & imaging results that were available during my care of the patient were reviewed by me and considered in my medical decision making (see chart for details).   59 year old male presenting for 3-day history of body aches, fatigue, cough, congestion, sore throat, diarrhea, and shortness of breath at times with walking.  He is ill-appearing in the clinic, but is in no acute distress.  He has mild posterior pharyngeal erythema nasal congestion.  Chest is clear to auscultation and heart regular rate and rhythm.  Respiratory panel obtained and patient is positive for COVID-19.  CDC guidelines, isolation protocol and ED precautions discussed with patient.  Advised supportive care with increasing rest and fluids.  Sent Bromfed-DM cough syrup to pharmacy to help with symptoms.  Work note provided.  I have contacted the infusion clinic for MAB therapy on patient.  ED precautions again reviewed with patient.   Final Clinical Impressions(s) / UC Diagnoses   Final diagnoses:  COVID-19  Cough  Fatigue, unspecified type  Diarrhea, unspecified type     Discharge Instructions     Your COVID-19 test  was positive today.  You will need to isolate 10 days from symptom onset.  Care is largely supportive, but I cannot call the infusion clinic and see if they are able to get you in for MAB therapy.  If symptomatic, go home and rest. Push fluids. Take Tylenol as needed for discomfort. Gargle warm salt water. Throat lozenges. Take Mucinex DM or Robitussin for cough. Humidifier in bedroom to ease coughing. Warm showers. Also review the COVID handout for more information.  COVID-19 INFECTION: The incubation period of COVID-19 is approximately 14 days after exposure, with most symptoms developing in roughly 4-5 days. Symptoms may range in severity from mild to critically severe. Roughly 80% of those infected will have mild symptoms. People of any age may become infected with COVID-19 and have the ability to transmit the virus. The most common symptoms include: fever, fatigue, cough, body aches, headaches, sore throat, nasal congestion, shortness of breath, nausea, vomiting, diarrhea, changes in smell and/or taste.    COURSE OF ILLNESS Some patients may begin with mild disease which can progress quickly into critical symptoms. If your symptoms are worsening please call ahead to the Emergency Department and proceed there for further treatment. Recovery time appears to be roughly 1-2 weeks for mild symptoms and 3-6 weeks for severe disease.   GO IMMEDIATELY TO ER FOR FEVER YOU ARE UNABLE TO GET DOWN WITH TYLENOL, BREATHING PROBLEMS, CHEST PAIN, FATIGUE, LETHARGY, INABILITY TO EAT OR DRINK, ETC  QUARANTINE AND ISOLATION: To help decrease the spread of COVID-19 please remain isolated if you have COVID infection or are highly suspected to have COVID infection. This means -  stay home and isolate to one room in the home if you live with others. Do not share a bed or bathroom with others while ill, sanitize and wipe down all countertops and keep common areas clean and disinfected. You may discontinue isolation if you have  a mild case and are asymptomatic 10 days after symptom onset as long as you have been fever free >24 hours without having to take Motrin or Tylenol. If your case is more severe (meaning you develop pneumonia or are admitted in the hospital), you may have to isolate longer.   If you have been in close contact (within 6 feet) of someone diagnosed with COVID 19, you are advised to quarantine in your home for 14 days as symptoms can develop anywhere from 2-14 days after exposure to the virus. If you develop symptoms, you  must isolate.  Most current guidelines for COVID after exposure -isolate 10 days if you ARE NOT tested for COVID as long as symptoms do not develop -isolate 7 days if you are tested and remain asymptomatic -You do not necessarily need to be tested for COVID if you have + exposure and        develop   symptoms. Just isolate at home x10 days from symptom onset During this global pandemic, CDC advises to practice social distancing, try to stay at least 39ft away from others at all times. Wear a face covering. Wash and sanitize your hands regularly and avoid going anywhere that is not necessary.  KEEP IN MIND THAT THE COVID TEST IS NOT 100% ACCURATE AND YOU SHOULD STILL DO EVERYTHING TO PREVENT POTENTIAL SPREAD OF VIRUS TO OTHERS (WEAR MASK, WEAR GLOVES, WASH HANDS AND SANITIZE REGULARLY). IF INITIAL TEST IS NEGATIVE, THIS MAY NOT MEAN YOU ARE DEFINITELY NEGATIVE. MOST ACCURATE TESTING IS DONE 5-7 DAYS AFTER EXPOSURE.   It is not advised by CDC to get re-tested after receiving a positive COVID test since you can still test positive for weeks to months after you have already cleared the virus.   *If you have not been vaccinated for COVID, I strongly suggest you consider getting vaccinated as long as there are no contraindications.      ED Prescriptions    Medication Sig Dispense Auth. Provider   brompheniramine-pseudoephedrine-DM 30-2-10 MG/5ML syrup Take 10 mLs by mouth 4 (four) times  daily as needed for up to 7 days. 150 mL Shirlee Latch, PA-C     PDMP not reviewed this encounter.   Shirlee Latch, PA-C 08/02/20 1323

## 2020-08-03 ENCOUNTER — Encounter: Payer: Self-pay | Admitting: Adult Health

## 2020-08-03 ENCOUNTER — Other Ambulatory Visit (HOSPITAL_COMMUNITY): Payer: Self-pay | Admitting: Adult Health

## 2020-08-03 DIAGNOSIS — U071 COVID-19: Secondary | ICD-10-CM

## 2020-08-03 NOTE — Progress Notes (Signed)
I connected by phone with Jonathon Morris on 08/03/2020 at 9:15 AM to discuss the potential use of a new treatment for mild to moderate COVID-19 viral infection in non-hospitalized patients.  This patient is a 59 y.o. male that meets the FDA criteria for Emergency Use Authorization of COVID monoclonal antibody casirivimab/imdevimab, bamlanivimab/eteseviamb, or sotrovimab.  Has a (+) direct SARS-CoV-2 viral test result  Has mild or moderate COVID-19   Is NOT hospitalized due to COVID-19  Is within 10 days of symptom onset  Has at least one of the high risk factor(s) for progression to severe COVID-19 and/or hospitalization as defined in EUA.  Specific high risk criteria : BMI > 25 and Cardiovascular disease or hypertension   I have spoken and communicated the following to the patient or parent/caregiver regarding COVID monoclonal antibody treatment:  1. FDA has authorized the emergency use for the treatment of mild to moderate COVID-19 in adults and pediatric patients with positive results of direct SARS-CoV-2 viral testing who are 27 years of age and older weighing at least 40 kg, and who are at high risk for progressing to severe COVID-19 and/or hospitalization.  2. The significant known and potential risks and benefits of COVID monoclonal antibody, and the extent to which such potential risks and benefits are unknown.  3. Information on available alternative treatments and the risks and benefits of those alternatives, including clinical trials.  4. Patients treated with COVID monoclonal antibody should continue to self-isolate and use infection control measures (e.g., wear mask, isolate, social distance, avoid sharing personal items, clean and disinfect "high touch" surfaces, and frequent handwashing) according to CDC guidelines.   5. The patient or parent/caregiver has the option to accept or refuse COVID monoclonal antibody treatment.  After reviewing this information with the  patient, the patient has agreed to receive one of the available covid 19 monoclonal antibodies and will be provided an appropriate fact sheet prior to infusion. Noreene Filbert, NP 08/03/2020 9:15 AM

## 2020-08-04 ENCOUNTER — Ambulatory Visit (HOSPITAL_COMMUNITY)
Admission: RE | Admit: 2020-08-04 | Discharge: 2020-08-04 | Disposition: A | Discharge status: Home / Self Care | Payer: Managed Care, Other (non HMO) | Source: Ambulatory Visit | Attending: Pulmonary Disease | Admitting: Pulmonary Disease

## 2020-08-04 DIAGNOSIS — U071 COVID-19: Secondary | ICD-10-CM | POA: Insufficient documentation

## 2020-08-04 MED ORDER — FAMOTIDINE IN NACL 20-0.9 MG/50ML-% IV SOLN: 20.0000 mg | Freq: Once | INTRAVENOUS | Status: DC | PRN

## 2020-08-04 MED ORDER — SODIUM CHLORIDE 0.9 % IV SOLN: INTRAVENOUS | Status: DC | PRN

## 2020-08-04 MED ORDER — ALBUTEROL SULFATE HFA 108 (90 BASE) MCG/ACT IN AERS: 2.0000 | INHALATION_SPRAY | Freq: Once | RESPIRATORY_TRACT | Status: DC | PRN

## 2020-08-04 MED ORDER — DIPHENHYDRAMINE HCL 50 MG/ML IJ SOLN: 50.0000 mg | Freq: Once | INTRAMUSCULAR | Status: DC | PRN

## 2020-08-04 MED ORDER — METHYLPREDNISOLONE SODIUM SUCC 125 MG IJ SOLR: 125.0000 mg | Freq: Once | INTRAMUSCULAR | Status: DC | PRN

## 2020-08-04 MED ORDER — SODIUM CHLORIDE 0.9 % IV SOLN: Freq: Once | INTRAVENOUS | Status: AC

## 2020-08-04 MED ORDER — EPINEPHRINE 0.3 MG/0.3ML IJ SOAJ: 0.3000 mg | Freq: Once | INTRAMUSCULAR | Status: DC | PRN

## 2020-08-04 NOTE — Progress Notes (Signed)
Patient reviewed Fact Sheet for Patients, Parents, and Caregivers for Emergency Use Authorization (EUA) of Bam/Este for the Treatment of Coronavirus. Patient also reviewed and is agreeable to the estimated cost of treatment. Patient is agreeable to proceed.    

## 2020-08-04 NOTE — Progress Notes (Signed)
  Diagnosis: COVID-19  Physician: Dr. Wright  Procedure: Cov[facility] Infusion Clinic Med: bamlanivimab\etesevimab infusion - Provided patient with bamlanimivab\etesevimab fact sheet for patients, parents and caregivers prior to infusion.  Complications: No immediate complications noted.  Discharge: Discharged home   Asuncion Shibata J Bailynn Dyk 08/04/2020  

## 2020-08-04 NOTE — Discharge Instructions (Signed)
10 Things You Can Do to Manage Your COVID-19 Symptoms at Home If you have possible or confirmed COVID-19: 1. Stay home from work and school. And stay away from other public places. If you must go out, avoid using any kind of public transportation, ridesharing, or taxis. 2. Monitor your symptoms carefully. If your symptoms get worse, call your healthcare provider immediately. 3. Get rest and stay hydrated. 4. If you have a medical appointment, call the healthcare provider ahead of time and tell them that you have or may have COVID-19. 5. For medical emergencies, call 911 and notify the dispatch personnel that you have or may have COVID-19. 6. Cover your cough and sneezes with a tissue or use the inside of your elbow. 7. Wash your hands often with soap and water for at least 20 seconds or clean your hands with an alcohol-based hand sanitizer that contains at least 60% alcohol. 8. As much as possible, stay in a specific room and away from other people in your home. Also, you should use a separate bathroom, if available. If you need to be around other people in or outside of the home, wear a mask. 9. Avoid sharing personal items with other people in your household, like dishes, towels, and bedding. 10. Clean all surfaces that are touched often, like counters, tabletops, and doorknobs. Use household cleaning sprays or wipes according to the label instructions. cdc.gov/coronavirus 02/21/2019 This information is not intended to replace advice given to you by your health care provider. Make sure you discuss any questions you have with your health care provider. Document Revised: 07/26/2019 Document Reviewed: 07/26/2019 Elsevier Patient Education  2020 Else[employer] Inc[employer] What types of side effects do monoclonal antibody drugs cause?  Common side effects  In general, the more common side effects caused by monoclonal antibody drugs include: . Allergic reactions, such as hives or itching . Flu-like signs and  symptoms, including chills, fatigue, fever, and muscle aches and pains . Nausea, vomiting . Diarrhea . Skin rashes . Low blood pressure   The CDC is recommending patients who receive monoclonal antibody treatments wait at least 90 days before being vaccinated.  Currently, there are no data on the safety and efficacy of mRNA COVID-19 vaccines in persons who received monoclonal antibodies or convalescent plasma as part of COVID-19 treatment. Based on the estimated half-life of such therapies as well as evidence suggesting that reinfection is uncommon in the 90 days after initial infection, vaccination should be deferred for at least 90 days, as a precautionary measure until additional information becomes available, to avoid interference of the antibody treatment with vaccine-induced immune responses. If you have any questions or concerns after the infusion please call the Advanced Practice Provider on call at [phone]. This number is ONLY intended for your use regarding questions or concerns about the infusion post-treatment side-effects.  Please do not provide this number to others for use. For return to work notes please contact your primary care provider.   If someone you know is interested in receiving treatment please have them call the COVID hotline at [phone].   

## 2020-11-13 ENCOUNTER — Encounter: Payer: Self-pay | Admitting: Emergency Medicine

## 2020-11-13 ENCOUNTER — Ambulatory Visit
Admission: EM | Admit: 2020-11-13 | Discharge: 2020-11-13 | Disposition: A | Discharge status: Home / Self Care | Payer: BC Managed Care – PPO | Attending: Sports Medicine | Admitting: Sports Medicine

## 2020-11-13 ENCOUNTER — Ambulatory Visit (INDEPENDENT_AMBULATORY_CARE_PROVIDER_SITE_OTHER): Payer: BC Managed Care – PPO

## 2020-11-13 ENCOUNTER — Other Ambulatory Visit: Payer: Self-pay

## 2020-11-13 DIAGNOSIS — M7122 Synovial cyst of popliteal space [Baker], left knee: Secondary | ICD-10-CM

## 2020-11-13 DIAGNOSIS — M25562 Pain in left knee: Secondary | ICD-10-CM | POA: Diagnosis not present

## 2020-11-13 DIAGNOSIS — M7652 Patellar tendinitis, left knee: Secondary | ICD-10-CM | POA: Diagnosis not present

## 2020-11-13 DIAGNOSIS — M25462 Effusion, left knee: Secondary | ICD-10-CM

## 2020-11-13 MED ORDER — IBUPROFEN 800 MG PO TABS: 800.0000 mg | ORAL_TABLET | Freq: Three times a day (TID) | ORAL | 0 refills | Status: AC

## 2020-11-13 NOTE — ED Triage Notes (Signed)
Pt c/o left knee pain. Started about a month ago. No known injury. He states it feels tight, like it is swollen.

## 2020-11-13 NOTE — Discharge Instructions (Addendum)
Your x-ray does not show any fracture or bony abnormality.  My independent review shows some mild spurring.  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  You would certainly benefit from seeing orthopedics.  I have given you information and you need to call them directly to set up a time to see them. I have also sent in a prescription for high-dose ibuprofen.  Please take it with food.  Do not take any other anti-inflammatories.  You can take Tylenol only if you need to take anything else if the ibuprofen is not helping 100%. Educational handouts provided.  If you cannot get into orthopedics in a timely fashion, please see your primary care provider.  I hope you get to feeling better, Dr. Zachery Dauer

## 2020-11-18 NOTE — ED Provider Notes (Signed)
[facility]    CSN: [id number] Arrival date & time: 11/13/20  [id number]      History   Chief Complaint Chief Complaint  Patient presents with  . Knee Pain    left    HPI Jonathon Morris is a 60 y.o. male.   Patient is a pleasant 60 year old male presents for evaluation of the above issue.  He reports about 1 month of left knee pain mostly in the popliteal fossa but a little bit anteriorly.  He is noted that he has a little bit of swelling.  Has been taking ibuprofen.  He says it feels really tight.  He denies any accidents trauma falls or twists.  Normally sees [facility] but was unable to get an appointment.  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  He says it feels unstable at times.  No chronic problems with his knee.  He denies any significant calf pain shortness of breath or chest pain.  He has no history of DVT MI or CVA in the past.  No significant vascular history.  He does have diabetes and his last hemoglobin A1c he reports was 6.8-6.9.  No shortness of breath or chest pain.  No red flag signs or symptoms.     Past Medical History:  Diagnosis Date  . Hypertension   . Type 2 diabetes mellitus [facility](HCC)     Patient Active Problem List   Diagnosis Date Noted  . Nephrolithiasis 10/17/2017  . Lower urinary tract symptoms (LUTS) 10/17/2017    Past Surgical History:  Procedure Laterality Date  . KIDNEY STONE SURGERY    . LITHOTRIPSY         Home Medications    Prior to Admission medications   Medication Sig Start Date End Date Taking? Authorizing Provider  atorvastatin (LIPITOR) 40 MG tablet Take by mouth. 02/28/18 11/13/20 Yes [provider]  finasteride (PROSCAR) 5 MG tablet Take 5 mg by mouth daily.   Yes [provider]  ibuprofen (ADVIL) 800 MG tablet Take 1 tablet (800 mg total) by mouth 3 (three) times daily. 11/13/20  Yes Delton SeeBarnes, Bryonna Sundby, MD  losartan (COZAAR) 100 MG tablet Take 100 mg by  mouth daily.   Yes [provider]  metformin (FORTAMET) 500 MG (OSM) 24 hr tablet Take 500 mg by mouth daily with breakfast.   Yes [provider]  sildenafil (VIAGRA) 100 MG tablet Take 100 mg by mouth daily as needed for erectile dysfunction.   Yes [provider]  tamsulosin (FLOMAX) 0.4 MG CAPS capsule Take 1 capsule (0.4 mg total) by mouth daily. 11/21/18  Yes Stoioff, Verna CzechScott C, MD  timolol (TIMOPTIC) 0.5 % ophthalmic solution 1 drop 2 (two) times daily.   Yes [provider]  benzonatate (TESSALON) 200 MG capsule Take 1 capsule (200 mg total) by mouth 3 (three) times daily as needed for cough. 08/08/19   Payton Mccallumonty, [place], MD  cyclobenzaprine (FLEXERIL) 10 MG tablet Take 1 tablet (10 mg total) by mouth 3 (three) times daily as needed for muscle spasms. 11/21/19   Payton Mccallumonty, [place], MD    Family History Family History  Problem Relation Age of Onset  . ALS Mother   . Hypertension Father   . Diabetes Father     Social History Social History   Tobacco Use  . Smoking status: Never Smoker  . Smokeless tobacco: Never Used  Vaping Use  . Vaping Use: Never used  Substance Use Topics  . Alcohol use: Yes    Comment: rare  . Drug use: No     Allergies   Patient has no known allergies.   Review of Systems Review of Systems  Constitutional: Positive for activity change. Negative for appetite change, chills, diaphoresis, fatigue and fever.  HENT: Negative.   Eyes: Negative.   Respiratory: Negative.  Negative for cough, chest tightness, shortness of breath, wheezing and stridor.   Cardiovascular: Negative.  Negative for chest pain, palpitations and leg swelling.  Gastrointestinal: Negative.   Genitourinary: Negative.   Musculoskeletal: Positive for arthralgias, gait problem and joint swelling. Negative for back pain, myalgias, neck pain and neck stiffness.  Skin: Negative.  Negative for color change, rash and wound.  Neurological: Negative for syncope,  weakness, light-headedness and headaches.  All other systems reviewed and are negative.    Physical Exam Triage Vital Signs ED Triage Vitals  Enc Vitals Group     BP 11/13/20 0951 (!) 144/79     Pulse Rate 11/13/20 0951 88     Resp 11/13/20 0951 18     Temp 11/13/20 0951 98.2 F (36.8 C)     Temp Source 11/13/20 0951 Oral     SpO2 11/13/20 0951 98 %     Weight 11/13/20 0948 240 lb (108.9 kg)     Height 11/13/20 0948 5\' 6"  (1.676 m)     Head Circumference --      Peak Flow --      Pain Score 11/13/20 0948 5     Pain Loc --      Pain Edu? --      Excl. in GC? --    No data found.  Updated Vital Signs BP (!) 144/79 (BP Location: Left Arm)   Pulse 88   Temp 98.2 F (36.8 C) (Oral)   Resp 18   Ht 5\' 6"  (1.676 m)   Wt 108.9 kg   SpO2 98%   BMI 38.74 kg/m   Visual Acuity Right Eye Distance:   Left Eye Distance:   Bilateral Distance:    Right Eye Near:   Left Eye Near:    Bilateral Near:     Physical Exam Vitals and nursing note reviewed.  Constitutional:      General: He is not in acute distress.    Appearance: Normal appearance. He is not ill-appearing, toxic-appearing or diaphoretic.  HENT:     Head: Normocephalic and atraumatic.  Cardiovascular:     Rate and Rhythm: Normal rate.     Pulses: Normal pulses.     Heart sounds: Normal heart sounds. No murmur heard. No friction rub. No gallop.   Pulmonary:     Effort: Pulmonary effort is normal. No respiratory distress.     Breath sounds: Normal breath sounds. No stridor. No wheezing, rhonchi or rales.  Musculoskeletal:        General: Tenderness present. No deformity or signs of injury.     Cervical back: Normal range of motion and neck supple.     Right lower leg: No edema.     Left lower leg: No edema.     Comments: Right knee: Some mild crepitus on range of motion but otherwise normal to inspection palpation range of motion.  Left knee: No bony abnormality ecchymosis erythema.  There is a little bit of  an effusion noted.  No soft tissue swelling.  A little bit of fullness in the popliteal fossa consistent with a Baker's cyst but it  does not track into the calf.  Homans and [place] test are negative.  He has a little bit of tenderness over the patellar tendon as well as over the medial joint line.  That said there is no ligamentous laxity.  Anterior drawer and Lachman are negative.  No evidence of meniscus pathology.  Quadriceps tendon is intact.  Little bit of atrophy of the quadricep mechanism.  Neurovascular: Normal sensation 2+ pulses.  Skin:    General: Skin is warm and dry.     Capillary Refill: Capillary refill takes less than 2 seconds.     Findings: No erythema, lesion or rash.  Neurological:     General: No focal deficit present.     Mental Status: He is alert.      UC Treatments / Results  Labs (all labs ordered are listed, but only abnormal results are displayed) Labs Reviewed - No data to display  EKG   Radiology CLINICAL DATA:  Left knee pain for 1 month without known injury.  EXAM: LEFT KNEE - COMPLETE 4+ VIEW  COMPARISON:  None.  FINDINGS: No evidence of fracture, dislocation, or joint effusion. No evidence of arthropathy or other focal bone abnormality. Soft tissues are unremarkable.  IMPRESSION: Negative.   Electronically Signed   By: Lupita Raider M.D.   On: 11/13/2020 11:28  Procedures Procedures (including critical care time)  Medications Ordered in UC Medications - No data to display  Initial Impression / Assessment and Plan / UC Course  I have reviewed the triage vital signs and the nursing notes.  Pertinent labs & imaging results that were available during my care of the patient were reviewed by me and considered in my medical decision making (see chart for details).  Clinical impression: Left knee pain for about 1 month.  No injury.  Clinically has a Baker's cyst and probably a degenerative meniscus.  There is a small effusion  noted.  He also has patellar tendinitis.  Treatment plan: 1.  The findings and treatment were discussed in detail with the patient.  Patient was in agreement. 2.  I recommended getting an x-ray of his left knee.  Results are above.  My independent review of this x-rays showed some spurring that is mild.  No acute osseous findings. 3.  Clinically he has a Baker's cyst with patellar tendinitis.  Will refer to orthopedics for evaluation and management. 4.  I have prescribed a high-dose ibuprofen and sent to his pharmacy.  I asked him not to take any other anti-inflammatories.  He can supplement with Tylenol as needed. 5.  Educational handouts provided. 6.  I offered a work note but he did said he did not need one. 7.  If symptoms persist that he cannot get into orthopedics he knows to contact his primary care office.    Final Clinical Impressions(s) / UC Diagnoses   Final diagnoses:  Acute pain of left knee  Baker cyst, left  Swelling of left knee joint  Patellar tendinitis of left knee     Discharge Instructions     Your x-ray does not show any fracture or bony abnormality.  My independent review shows some mild spurring.  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  You would certainly benefit from seeing orthopedics.  I have given you information and you need to call them directly to  set up a time to see them. I have also sent in a prescription for high-dose ibuprofen.  Please take it with food.  Do not take any other anti-inflammatories.  You can take Tylenol only if you need to take anything else if the ibuprofen is not helping 100%. Educational handouts provided.  If you cannot get into orthopedics in a timely fashion, please see your primary care provider.  I hope you get to feeling better, Dr. Zachery Dauer   ED Prescriptions    Medication Sig  Dispense Auth. Provider   ibuprofen (ADVIL) 800 MG tablet Take 1 tablet (800 mg total) by mouth 3 (three) times daily. 30 tablet Delton See, MD     PDMP not reviewed this encounter.   Delton See, MD 11/18/20 2026

## 2021-06-22 IMAGING — CR DG KNEE COMPLETE 4+V*L*
4 series · 4 of 4 positions shown · non-contrast
Comparison: None.

CLINICAL DATA: Left knee pain for 1 month without known injury.

EXAM:
LEFT KNEE - COMPLETE 4+ VIEW

[knee ap]
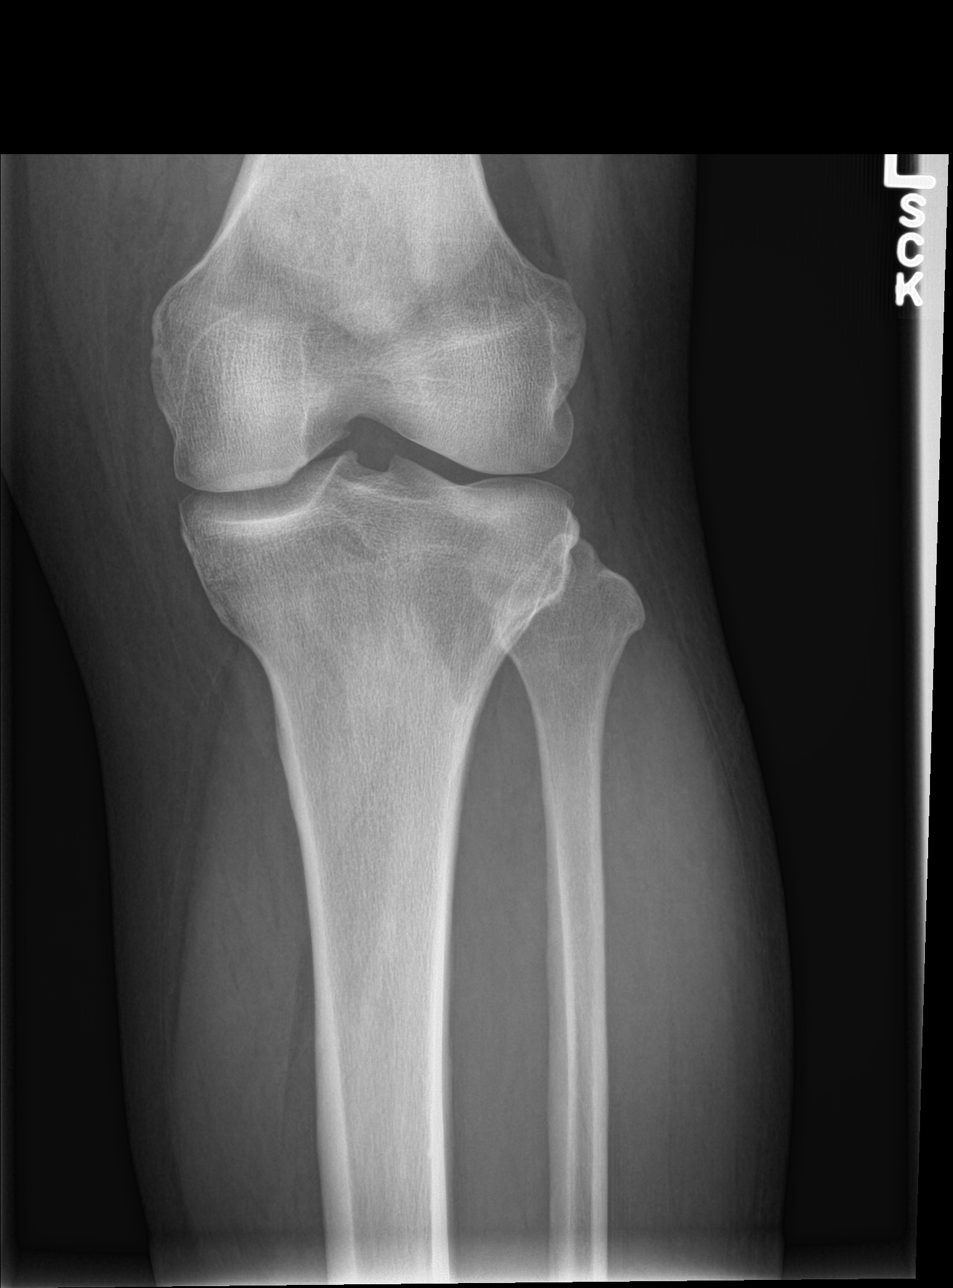

[knee lat]
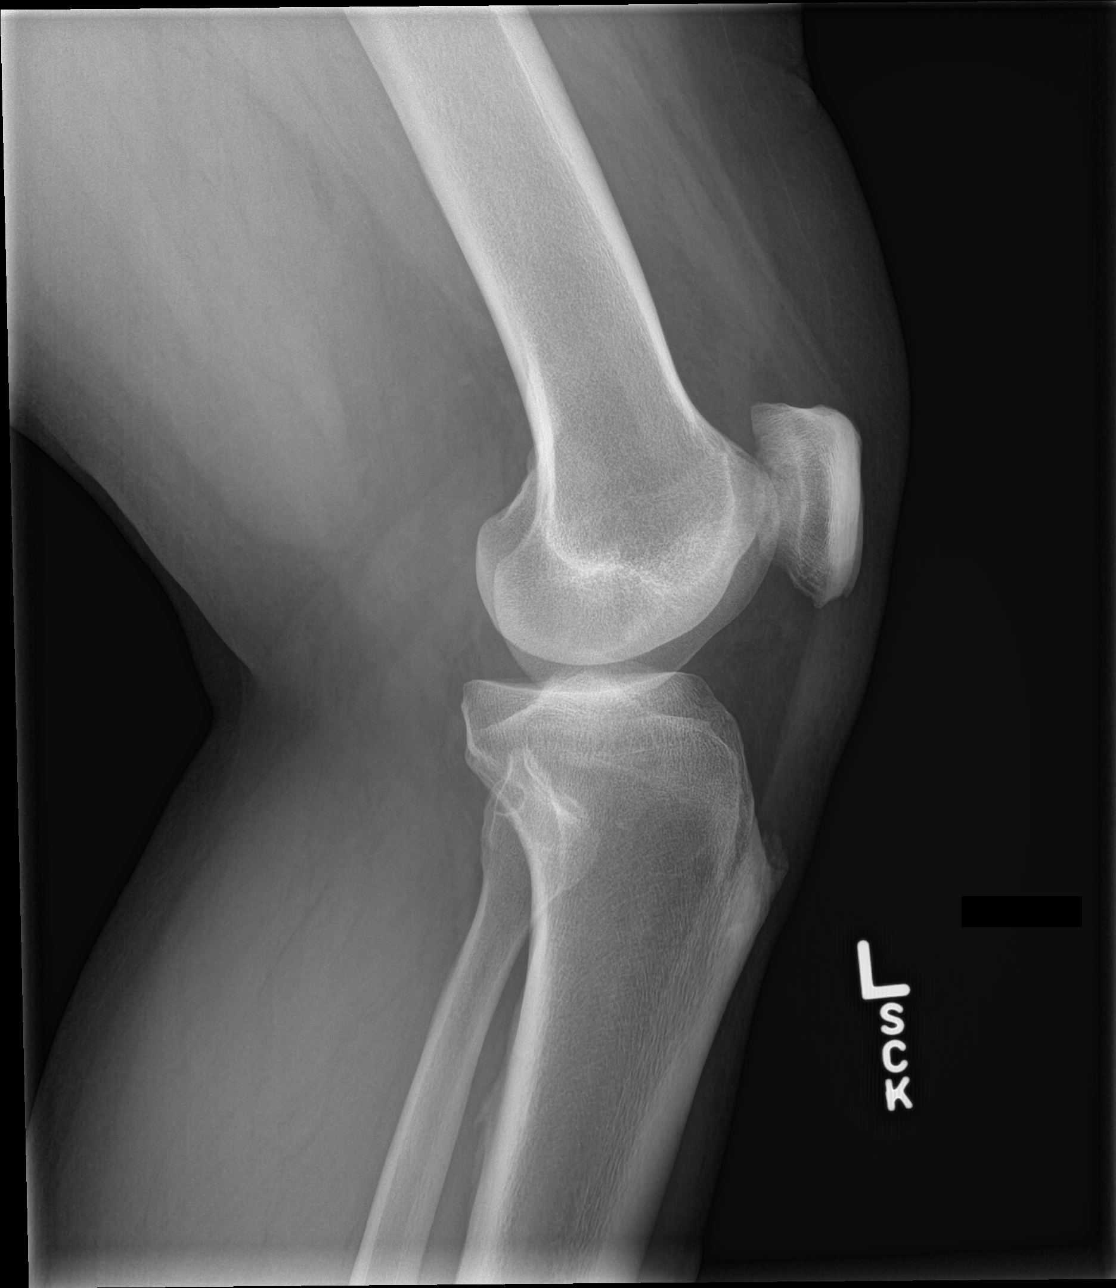

[tunnel]
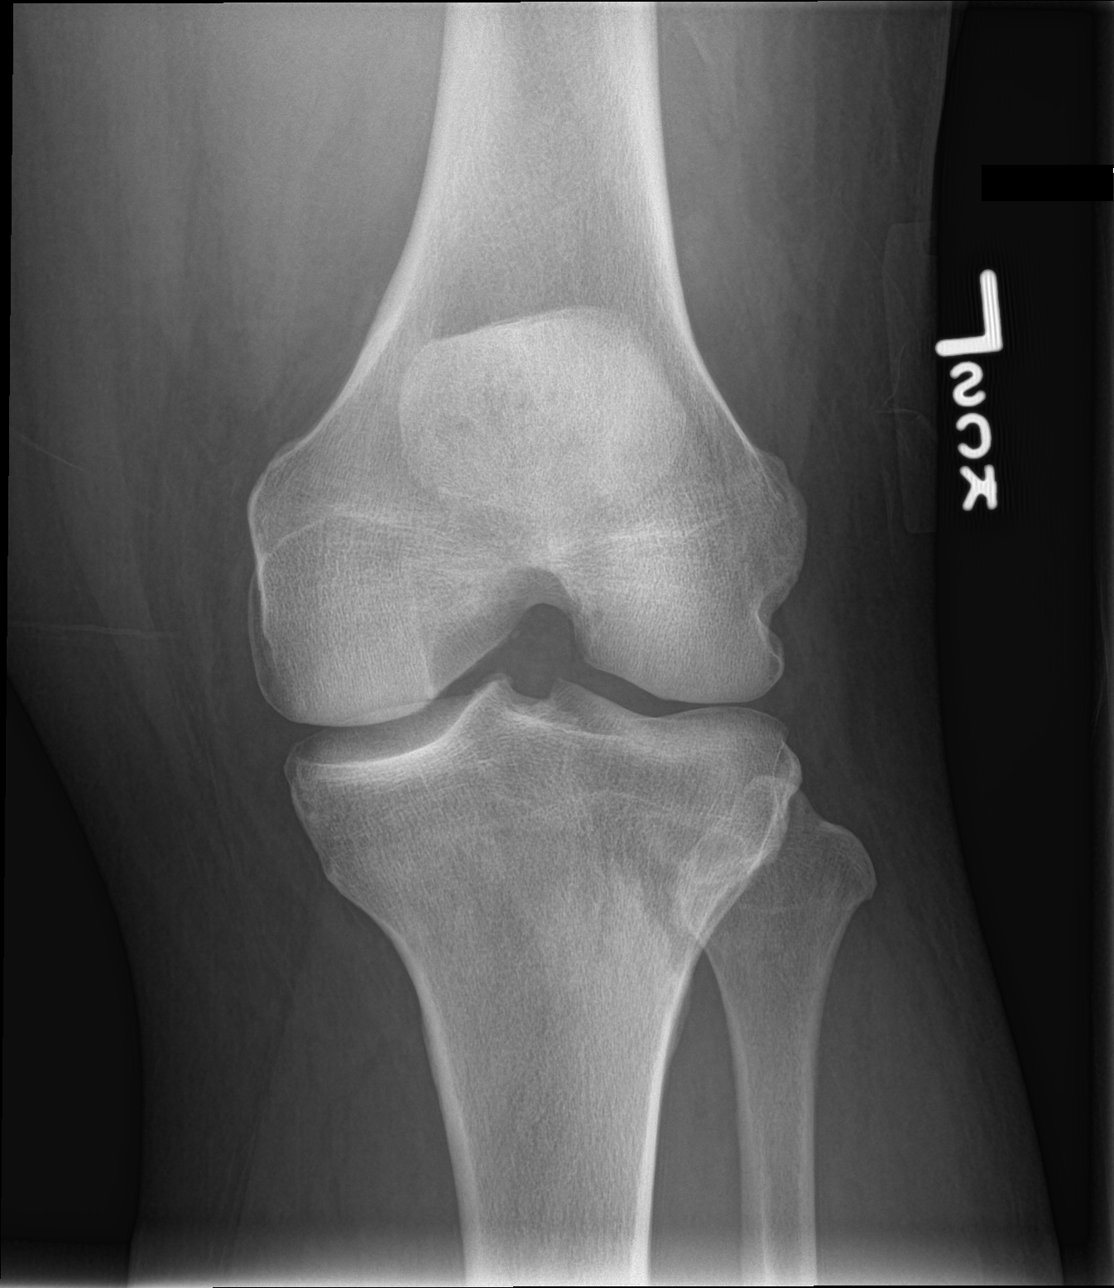

[patella skyline]
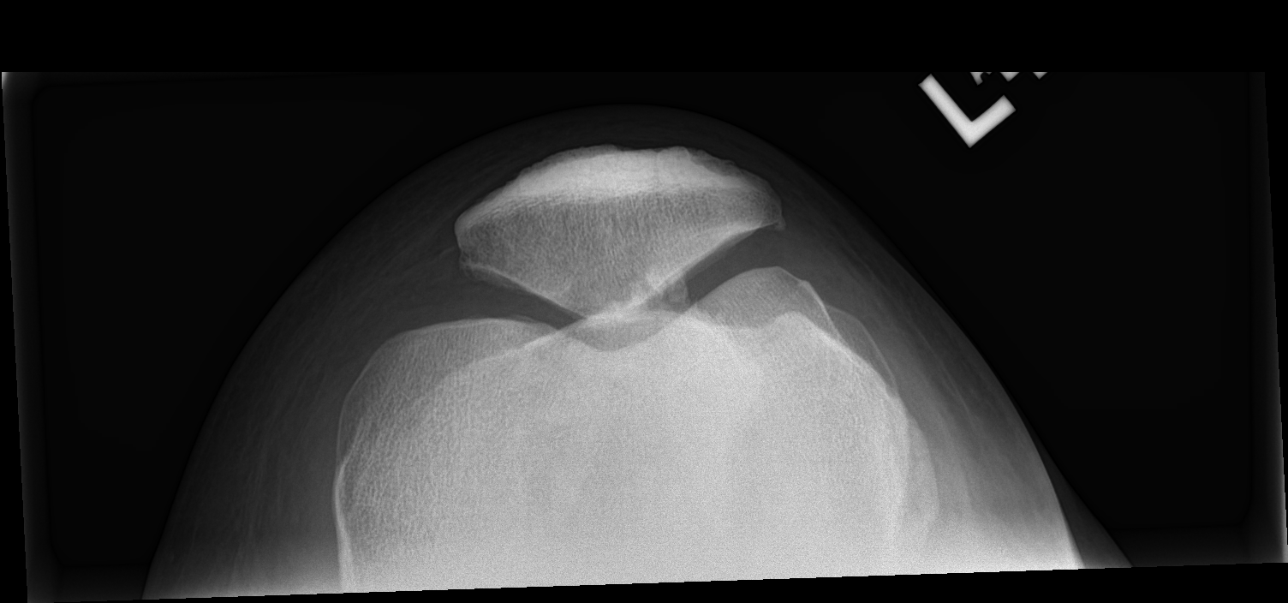

[4 of 4 positions shown; findings below may reference images not displayed]

FINDINGS: No evidence of fracture, dislocation, or joint effusion. No evidence
of arthropathy or other focal bone abnormality. Soft tissues are
unremarkable.
IMPRESSION: Negative.

## 2023-05-20 ENCOUNTER — Encounter
Admission: RE | Disposition: A | Discharge status: Home / Self Care | Payer: Self-pay | Source: Home / Self Care | Attending: Internal Medicine

## 2023-05-20 ENCOUNTER — Ambulatory Visit
Admission: RE | Admit: 2023-05-20 | Discharge: 2023-05-20 | Disposition: A | Discharge status: Home / Self Care | Payer: BC Managed Care – PPO | Attending: Internal Medicine | Admitting: Internal Medicine

## 2023-05-20 DIAGNOSIS — R9430 Abnormal result of cardiovascular function study, unspecified: Secondary | ICD-10-CM | POA: Diagnosis present

## 2023-05-20 HISTORY — PX: LEFT HEART CATH AND CORONARY ANGIOGRAPHY: CATH118249

## 2023-05-20 LAB — GLUCOSE, CAPILLARY
Glucose-Capillary: 86 mg/dL (ref 70–99)
Glucose-Capillary: 48 mg/dL — ABNORMAL LOW (ref 70–99)
Glucose-Capillary: 105 mg/dL — ABNORMAL HIGH (ref 70–99)

## 2023-05-20 LAB — CARDIAC CATHETERIZATION: Cath EF Quantitative: 60 %

## 2023-05-20 SURGERY — LEFT HEART CATH AND CORONARY ANGIOGRAPHY
Anesthesia: Moderate Sedation | Laterality: Left

## 2023-05-20 MED ORDER — HYDRALAZINE HCL 20 MG/ML IJ SOLN: 10.0000 mg | INTRAMUSCULAR | Status: DC | PRN

## 2023-05-20 MED ORDER — SODIUM CHLORIDE 0.9 % WEIGHT BASED INFUSION: 1.0000 mL/kg/h | INTRAVENOUS | Status: DC

## 2023-05-20 MED ORDER — HEPARIN (PORCINE) IN NACL 1000-0.9 UT/500ML-% IV SOLN
INTRAVENOUS | Status: AC
  Filled 2023-05-20: qty 1000

## 2023-05-20 MED ORDER — FENTANYL CITRATE (PF) 100 MCG/2ML IJ SOLN
INTRAMUSCULAR | Status: DC | PRN
  Administered 2023-05-20 (×2): 25 ug via INTRAVENOUS

## 2023-05-20 MED ORDER — ONDANSETRON HCL 4 MG/2ML IJ SOLN: 4.0000 mg | Freq: Four times a day (QID) | INTRAMUSCULAR | Status: DC | PRN

## 2023-05-20 MED ORDER — HEPARIN SODIUM (PORCINE) 1000 UNIT/ML IJ SOLN
INTRAMUSCULAR | Status: AC
  Filled 2023-05-20: qty 10

## 2023-05-20 MED ORDER — SODIUM CHLORIDE 0.9% FLUSH: 3.0000 mL | Freq: Two times a day (BID) | INTRAVENOUS | Status: DC

## 2023-05-20 MED ORDER — SODIUM CHLORIDE 0.9% FLUSH: 3.0000 mL | INTRAVENOUS | Status: DC | PRN

## 2023-05-20 MED ORDER — IOHEXOL 300 MG/ML  SOLN
INTRAMUSCULAR | Status: DC | PRN
  Administered 2023-05-20: 65 mL

## 2023-05-20 MED ORDER — HEPARIN SODIUM (PORCINE) 1000 UNIT/ML IJ SOLN
INTRAMUSCULAR | Status: DC | PRN
  Administered 2023-05-20: 5000 [IU] via INTRAVENOUS

## 2023-05-20 MED ORDER — SODIUM CHLORIDE 0.9 % IV SOLN: 250.0000 mL | INTRAVENOUS | Status: DC | PRN

## 2023-05-20 MED ORDER — DEXTROSE 50 % IV SOLN: 25.0000 mL | Freq: Once | INTRAVENOUS | Status: AC

## 2023-05-20 MED ORDER — ASPIRIN 81 MG PO CHEW
CHEWABLE_TABLET | ORAL | Status: AC
  Filled 2023-05-20: qty 1

## 2023-05-20 MED ORDER — LABETALOL HCL 5 MG/ML IV SOLN: 10.0000 mg | INTRAVENOUS | Status: DC | PRN

## 2023-05-20 MED ORDER — VERAPAMIL HCL 2.5 MG/ML IV SOLN
INTRAVENOUS | Status: DC | PRN
  Administered 2023-05-20: 2.5 mg via INTRAVENOUS

## 2023-05-20 MED ORDER — MIDAZOLAM HCL 2 MG/2ML IJ SOLN
INTRAMUSCULAR | Status: AC
  Filled 2023-05-20: qty 2

## 2023-05-20 MED ORDER — DEXTROSE 50 % IV SOLN
INTRAVENOUS | Status: AC
  Administered 2023-05-20: 25 mL via INTRAVENOUS
  Filled 2023-05-20: qty 50

## 2023-05-20 MED ORDER — ASPIRIN 81 MG PO CHEW
81.0000 mg | CHEWABLE_TABLET | ORAL | Status: AC
  Administered 2023-05-20: 81 mg via ORAL

## 2023-05-20 MED ORDER — HEPARIN (PORCINE) IN NACL 2000-0.9 UNIT/L-% IV SOLN
INTRAVENOUS | Status: DC | PRN
  Administered 2023-05-20: 1000 mL

## 2023-05-20 MED ORDER — MIDAZOLAM HCL 2 MG/2ML IJ SOLN
INTRAMUSCULAR | Status: DC | PRN
  Administered 2023-05-20 (×2): 1 mg via INTRAVENOUS

## 2023-05-20 MED ORDER — FENTANYL CITRATE (PF) 100 MCG/2ML IJ SOLN
INTRAMUSCULAR | Status: AC
  Filled 2023-05-20: qty 2

## 2023-05-20 MED ORDER — SODIUM CHLORIDE 0.9 % WEIGHT BASED INFUSION
3.0000 mL/kg/h | INTRAVENOUS | Status: AC
  Administered 2023-05-20: 3 mL/kg/h via INTRAVENOUS

## 2023-05-20 MED ORDER — VERAPAMIL HCL 2.5 MG/ML IV SOLN
INTRAVENOUS | Status: AC
  Filled 2023-05-20: qty 2

## 2023-05-20 MED ORDER — ACETAMINOPHEN 325 MG PO TABS: 650.0000 mg | ORAL_TABLET | ORAL | Status: DC | PRN

## 2023-05-20 SURGICAL SUPPLY — 11 items
CATH 5FR JL3.5 JR4 ANG PIG MP (CATHETERS) IMPLANT
DEVICE RAD TR BAND REGULAR (VASCULAR PRODUCTS) IMPLANT
DRAPE BRACHIAL (DRAPES) IMPLANT
GLIDESHEATH SLEND SS 6F .021 (SHEATH) IMPLANT
GUIDEWIRE INQWIRE 1.5J.035X260 (WIRE) IMPLANT
INQWIRE 1.5J .035X260CM (WIRE) ×1
PACK CARDIAC CATH (CUSTOM PROCEDURE TRAY) ×1 IMPLANT
PROTECTION STATION PRESSURIZED (MISCELLANEOUS) ×1
SET ATX-X65L (MISCELLANEOUS) IMPLANT
STATION PROTECTION PRESSURIZED (MISCELLANEOUS) IMPLANT
WIRE NITINOL .018 (WIRE) IMPLANT

## 2023-05-20 NOTE — Progress Notes (Signed)
Dr. Juliann Pares in now at bedside to speak with pt. And his brother re: cath results. MD made aware of low BG : orders received for D50W 1/2 amp.

## 2023-05-20 NOTE — Progress Notes (Signed)
Pt. BG: 48 on arrival to recovery room. Note to MD requesting Dextrose IV. Pt. Given 120 ml o.j. now & set up with a sandwich . Pt. States "I don't feel anything: normally I can feel it."

## 2023-05-20 NOTE — Progress Notes (Signed)
TR band air down to 9 ml by S. Eddins, RT. From 12 ml. Reverse barbeau a "D" on arrival. Post air deflation reverse barbeau "C."

## 2023-05-22 ENCOUNTER — Encounter: Payer: Self-pay | Admitting: Internal Medicine

## 2024-05-21 ENCOUNTER — Ambulatory Visit: Admitting: Certified Registered"

## 2024-05-21 ENCOUNTER — Encounter
Admission: RE | Disposition: A | Discharge status: Home / Self Care | Payer: Self-pay | Source: Home / Self Care | Attending: Gastroenterology

## 2024-05-21 ENCOUNTER — Encounter: Payer: Self-pay | Admitting: Gastroenterology

## 2024-05-21 ENCOUNTER — Ambulatory Visit
Admission: RE | Admit: 2024-05-21 | Discharge: 2024-05-21 | Disposition: A | Discharge status: Home / Self Care | Attending: Gastroenterology | Admitting: Gastroenterology

## 2024-05-21 DIAGNOSIS — K625 Hemorrhage of anus and rectum: Secondary | ICD-10-CM | POA: Diagnosis not present

## 2024-05-21 DIAGNOSIS — K640 First degree hemorrhoids: Secondary | ICD-10-CM | POA: Diagnosis not present

## 2024-05-21 DIAGNOSIS — E669 Obesity, unspecified: Secondary | ICD-10-CM | POA: Insufficient documentation

## 2024-05-21 DIAGNOSIS — Z79899 Other long term (current) drug therapy: Secondary | ICD-10-CM | POA: Insufficient documentation

## 2024-05-21 DIAGNOSIS — K5730 Diverticulosis of large intestine without perforation or abscess without bleeding: Secondary | ICD-10-CM | POA: Diagnosis not present

## 2024-05-21 DIAGNOSIS — I10 Essential (primary) hypertension: Secondary | ICD-10-CM | POA: Diagnosis not present

## 2024-05-21 DIAGNOSIS — Z6839 Body mass index (BMI) 39.0-39.9, adult: Secondary | ICD-10-CM | POA: Diagnosis not present

## 2024-05-21 DIAGNOSIS — E119 Type 2 diabetes mellitus without complications: Secondary | ICD-10-CM | POA: Diagnosis not present

## 2024-05-21 DIAGNOSIS — R1032 Left lower quadrant pain: Secondary | ICD-10-CM | POA: Diagnosis present

## 2024-05-21 DIAGNOSIS — Z7984 Long term (current) use of oral hypoglycemic drugs: Secondary | ICD-10-CM | POA: Insufficient documentation

## 2024-05-21 DIAGNOSIS — Z833 Family history of diabetes mellitus: Secondary | ICD-10-CM | POA: Insufficient documentation

## 2024-05-21 HISTORY — PX: COLONOSCOPY: SHX5424

## 2024-05-21 LAB — GLUCOSE, CAPILLARY: Glucose-Capillary: 107 mg/dL — ABNORMAL HIGH (ref 70–99)

## 2024-05-21 SURGERY — COLONOSCOPY
Anesthesia: General

## 2024-05-21 MED ORDER — PROPOFOL 500 MG/50ML IV EMUL
INTRAVENOUS | Status: DC | PRN
  Administered 2024-05-21: 120 ug/kg/min via INTRAVENOUS

## 2024-05-21 MED ORDER — SODIUM CHLORIDE 0.9 % IV SOLN: INTRAVENOUS | Status: DC

## 2024-05-21 MED ORDER — LIDOCAINE 2% (20 MG/ML) 5 ML SYRINGE
INTRAMUSCULAR | Status: DC | PRN
  Administered 2024-05-21: 20 mg via INTRAVENOUS

## 2024-05-21 MED ORDER — PROPOFOL 10 MG/ML IV BOLUS
INTRAVENOUS | Status: DC | PRN
  Administered 2024-05-21: 100 mg via INTRAVENOUS

## 2024-05-21 NOTE — Transfer of Care (Signed)
 Immediate Anesthesia Transfer of Care Note  Patient: Jonathon Morris  Procedure(s) Performed: COLONOSCOPY  Patient Location: Endoscopy Unit  Anesthesia Type:General  Level of Consciousness: awake, alert , and oriented  Airway & Oxygen Therapy: Patient Spontanous Breathing  Post-op Assessment: Report given to RN and Post -op Vital signs reviewed and stable  Post vital signs: Reviewed  Last Vitals:  Vitals Value Taken Time  BP 98/66 05/21/24 08:36  Temp 36.4 C 05/21/24 08:36  Pulse 86 05/21/24 08:36  Resp 19 05/21/24 08:36  SpO2 97 % 05/21/24 08:36    Last Pain:  Vitals:   05/21/24 0836  TempSrc: Tympanic  PainSc: 0-No pain         Complications: No notable events documented.

## 2024-05-21 NOTE — H&P (Signed)
 Ruel Kung , MD [address], [place], [place], [address] Phone: [phone] Fax: [phone]  Primary Care Physician:  Eliverto Bette Hover, MD   Pre-Procedure History & Physical: HPI:  Jonathon Morris is a 63 y.o. male is here for an colonoscopy.   Past Medical History:  Diagnosis Date   Hypertension    Type 2 diabetes mellitus (HCC)     Past Surgical History:  Procedure Laterality Date   KIDNEY STONE SURGERY     LEFT HEART CATH AND CORONARY ANGIOGRAPHY Left 05/20/2023   Procedure: LEFT HEART CATH AND CORONARY ANGIOGRAPHY;  Surgeon: Florencio Cara BIRCH, MD;  Location: [facility] INVASIVE CV LAB;  Service: Cardiovascular;  Laterality: Left;   LITHOTRIPSY      Prior to Admission medications   Medication Sig Start Date End Date Taking? Authorizing Provider  atorvastatin (LIPITOR) 40 MG tablet Take by mouth. 02/28/18 11/13/20  [provider]  benzonatate  (TESSALON ) 200 MG capsule Take 1 capsule (200 mg total) by mouth 3 (three) times daily as needed for cough. 08/08/19   Servando Hire, MD  cyclobenzaprine  (FLEXERIL ) 10 MG tablet Take 1 tablet (10 mg total) by mouth 3 (three) times daily as needed for muscle spasms. 11/21/19   Servando Hire, MD  finasteride (PROSCAR) 5 MG tablet Take 5 mg by mouth daily.    [provider]  ibuprofen  (ADVIL ) 800 MG tablet Take 1 tablet (800 mg total) by mouth 3 (three) times daily. 11/13/20   Gwenn Kent, MD  losartan (COZAAR) 100 MG tablet Take 100 mg by mouth daily.    [provider]  metformin (FORTAMET) 500 MG (OSM) 24 hr tablet Take 500 mg by mouth daily with breakfast.    [provider]  sildenafil (VIAGRA) 100 MG tablet Take 100 mg by mouth daily as needed for erectile dysfunction.    [provider]  tamsulosin  (FLOMAX ) 0.4 MG CAPS capsule Take 1 capsule (0.4 mg  total) by mouth daily. 11/21/18   Stoioff, Scott C, MD  timolol (TIMOPTIC) 0.5 % ophthalmic solution 1 drop 2 (two) times daily.    [provider]    Allergies as of 05/08/2024   (No Known Allergies)    Family History  Problem Relation Age of Onset   ALS Mother    Hypertension Father    Diabetes Father     Social History   Socioeconomic History   Marital status: Single    Spouse name: Not on file   Number of children: Not on file   Years of education: Not on file   Highest education level: Not on file  Occupational History   Not on file  Tobacco Use   Smoking status: Never   Smokeless tobacco: Never  Vaping Use   Vaping status: Never Used  Substance and Sexual Activity   Alcohol use: Yes    Comment: rare   Drug use: No   Sexual activity: Not on file  Other Topics Concern   Not on file  Social History Narrative   Not on file   Social Drivers of Health   Financial Resource Strain: High Risk (03/07/2023)   Received from [facility]   Overall Financial Resource Strain (CARDIA)    Difficulty of Paying Living Expenses: Hard  Food Insecurity: Food Insecurity Present (03/07/2023)   Received from [facility]   Hunger Vital Sign    Within the past 12 months, you worried that your food would run out  before you got the money to buy more.: Never true    Within the past 12 months, the food you bought just didn't last and you didn't have money to get more.: Sometimes true  Transportation Needs: No Transportation Needs (03/07/2023)   Received from [facility] - Transportation    In the past 12 months, has lack of transportation kept you from medical appointments or from getting medications?: No    Lack of Transportation (Non-Medical): No  Physical Activity: Not on file  Stress: Not on file  Social Connections: Not on file  Intimate Partner Violence: Not on file    Review of Systems: See HPI, otherwise  negative ROS  Physical Exam: There were no vitals taken for this visit. General:   Alert,  pleasant and cooperative in NAD Head:  Normocephalic and atraumatic. Neck:  Supple; no masses or thyromegaly. Lungs:  Clear throughout to auscultation, normal respiratory effort.    Heart:  +S1, +S2, Regular rate and rhythm, No edema. Abdomen:  Soft, nontender and nondistended. Normal bowel sounds, without guarding, and without rebound.   Neurologic:  Alert and  oriented x4;  grossly normal neurologically.  Impression/Plan: Jonathon Morris is here for an colonoscopy to be performed for rectal bleeding and LLQ pain Risks, benefits, limitations, and alternatives regarding  colonoscopy have been reviewed with the patient.  Questions have been answered.  All parties agreeable.   Ruel Kung, MD  05/21/2024, 7:48 AM]

## 2024-05-21 NOTE — Op Note (Signed)
 [facility] Patient Name: Jonathon Morris Procedure Date: 05/21/2024 8:05 AM MRN: [id number] Account #: [id number] Date of Birth: Sep 04, 1960 Admit Type: Outpatient Age: 63 Room: [facility] ENDO ROOM 1 Gender: Male Note Status: Finalized Instrument Name: Colon Scope [id number] Procedure:             Colonoscopy Indications:           Abdominal pain in the left lower quadrant, Rectal                         bleeding Providers:             Ruel Kung MD, MD Referring MD:          Bette BRAVO. Eliverto, MD (Referring MD) Medicines:             Monitored Anesthesia Care Complications:         No immediate complications. Procedure:             Pre-Anesthesia Assessment:                        - Prior to the procedure, a History and Physical was                         performed, and patient medications, allergies and                         sensitivities were reviewed. The patient's tolerance                         of previous anesthesia was reviewed.                        - The risks and benefits of the procedure and the                         sedation options and risks were discussed with the                         patient. All questions were answered and informed                         consent was obtained.                        - ASA Grade Assessment: II - A patient with mild                         systemic disease.                        After obtaining informed consent, the colonoscope was                         passed under direct vision. Throughout the procedure,                         the patient's blood pressure, pulse, and oxygen                         saturations were  monitored continuously. The                         Colonoscope was introduced through the anus and                         advanced to the the cecum, identified by the                         appendiceal orifice. The colonoscopy was performed                         with ease. The  patient tolerated the procedure well.                         The quality of the bowel preparation was good. The                         ileocecal valve, appendiceal orifice, and rectum were                         photographed. Findings:      The perianal and digital rectal examinations were normal.      Multiple small-mouthed diverticula were found in the sigmoid colon.      Internal hemorrhoids were found during retroflexion. The hemorrhoids       were medium-sized and Grade I (internal hemorrhoids that do not       prolapse).      The exam was otherwise without abnormality on direct and retroflexion       views. Impression:            - Diverticulosis in the sigmoid colon.                        - Internal hemorrhoids.                        - The examination was otherwise normal on direct and                         retroflexion views.                        - No specimens collected. Recommendation:        - Discharge patient to home (with escort).                        - Resume previous diet.                        - Continue present medications.                        - Repeat colonoscopy in 10 years for screening                         purposes.                        - If rectal bleeding persists consider hemorroidal  banding at the office Procedure Code(s):     --- Professional ---                        [phone], Colonoscopy, flexible; diagnostic, including                         collection of specimen(s) by brushing or washing, when                         performed (separate procedure) Diagnosis Code(s):     --- Professional ---                        K64.0, First degree hemorrhoids                        R10.32, Left lower quadrant pain                        K62.5, Hemorrhage of anus and rectum                        K57.30, Diverticulosis of large intestine without                         perforation or abscess without bleeding CPT copyright  2022 American Medical Association. All rights reserved. The codes documented in this report are preliminary and upon coder review may  be revised to meet current compliance requirements. Ruel Kung, MD Ruel Kung MD, MD 05/21/2024 8:35:42 AM This report has been signed electronically. Number of Addenda: 0 Note Initiated On: 05/21/2024 8:05 AM Scope Withdrawal Time: 0 hours 8 minutes 42 seconds  Total Procedure Duration: 0 hours 10 minutes 38 seconds  Estimated Blood Loss:  Estimated blood loss: none.      [facility]

## 2024-05-21 NOTE — Anesthesia Postprocedure Evaluation (Signed)
 Anesthesia Post Note  Patient: Jonathon Morris  Procedure(s) Performed: COLONOSCOPY  Patient location during evaluation: PACU Anesthesia Type: General Level of consciousness: awake and awake and alert Pain management: satisfactory to patient Vital Signs Assessment: post-procedure vital signs reviewed and stable Respiratory status: nonlabored ventilation Cardiovascular status: blood pressure returned to baseline Anesthetic complications: no   No notable events documented.   Last Vitals:  Vitals:   05/21/24 0846 05/21/24 0856  BP: 106/73 114/80  Pulse: 78 72  Resp: 18 15  Temp:    SpO2: 97% 99%    Last Pain:  Vitals:   05/21/24 0856  TempSrc:   PainSc: 0-No pain                 VAN STAVEREN,Londyn Hotard

## 2024-05-21 NOTE — Anesthesia Preprocedure Evaluation (Signed)
 Anesthesia Evaluation  Patient identified by MRN, date of birth, ID band Patient awake    Reviewed: Allergy & Precautions, NPO status , Patient's Chart, lab work & pertinent test results  Airway Mallampati: III  TM Distance: >3 FB Neck ROM: full    Dental  (+) Teeth Intact   Pulmonary neg pulmonary ROS   Pulmonary exam normal breath sounds clear to auscultation       Cardiovascular Exercise Tolerance: Good hypertension, Pt. on medications negative cardio ROS Normal cardiovascular exam Rhythm:Irregular Rate:Abnormal     Neuro/Psych negative neurological ROS  negative psych ROS   GI/Hepatic negative GI ROS, Neg liver ROS,,,  Endo/Other  negative endocrine ROSdiabetes, Type 2, Oral Hypoglycemic Agents    Renal/GU negative Renal ROS  negative genitourinary   Musculoskeletal negative musculoskeletal ROS (+)    Abdominal  (+) + obese  Peds negative pediatric ROS (+)  Hematology negative hematology ROS (+)   Anesthesia Other Findings Past Medical History: No date: Hypertension No date: Type 2 diabetes mellitus (HCC)  Past Surgical History: No date: KIDNEY STONE SURGERY 05/20/2023: LEFT HEART CATH AND CORONARY ANGIOGRAPHY; Left     Comment:  Procedure: LEFT HEART CATH AND CORONARY ANGIOGRAPHY;                Surgeon: Florencio Cara BIRCH, MD;  Location: [facility] INVASIVE              CV LAB;  Service: Cardiovascular;  Laterality: Left; No date: LITHOTRIPSY  BMI    Body Mass Index: 39.06 kg/m      Reproductive/Obstetrics negative OB ROS                              Anesthesia Physical Anesthesia Plan  ASA: 2  Anesthesia Plan: General   Post-op Pain Management:    Induction: Intravenous  PONV Risk Score and Plan: Propofol infusion and TIVA  Airway Management Planned: Natural Airway and Nasal Cannula  Additional Equipment:   Intra-op Plan:   Post-operative Plan:   Informed  Consent: I have reviewed the patients History and Physical, chart, labs and discussed the procedure including the risks, benefits and alternatives for the proposed anesthesia with the patient or authorized representative who has indicated his/her understanding and acceptance.     Dental Advisory Given  Plan Discussed with: CRNA  Anesthesia Plan Comments:         Anesthesia Quick Evaluation
